# Patient Record
Sex: Female | Born: 1970 | Hispanic: Yes | Marital: Married | Smoking: Never smoker
Health system: Southern US, Community
[De-identification: ages and names within clinical notes are randomized; demographics above are authoritative.]

## PROBLEM LIST (undated history)

## (undated) DIAGNOSIS — O24419 Gestational diabetes mellitus in pregnancy, unspecified control: Secondary | ICD-10-CM

## (undated) DIAGNOSIS — Z789 Other specified health status: Secondary | ICD-10-CM

## (undated) HISTORY — DX: Gestational diabetes mellitus in pregnancy, unspecified control: O24.419

## (undated) HISTORY — PX: NO PAST SURGERIES: SHX2092

## (undated) HISTORY — PX: BREAST ENHANCEMENT SURGERY: SHX7

---

## 2005-03-08 ENCOUNTER — Emergency Department: Payer: Self-pay | Admitting: Internal Medicine

## 2005-05-28 ENCOUNTER — Observation Stay: Payer: Self-pay | Admitting: Obstetrics and Gynecology

## 2005-08-18 ENCOUNTER — Inpatient Hospital Stay: Payer: Self-pay

## 2011-09-29 ENCOUNTER — Emergency Department: Payer: Self-pay | Admitting: Emergency Medicine

## 2012-07-07 ENCOUNTER — Emergency Department: Payer: Self-pay | Admitting: Emergency Medicine

## 2012-07-07 LAB — URINALYSIS, COMPLETE
Bilirubin,UR: NEGATIVE
Leukocyte Esterase: NEGATIVE
Nitrite: NEGATIVE
Ph: 6 (ref 4.5–8.0)
Protein: NEGATIVE
RBC,UR: <1 /HPF (ref 0–5)
Squamous Epithelial: 14
WBC UR: 1 /HPF (ref 0–5)

## 2012-07-07 LAB — COMPREHENSIVE METABOLIC PANEL
Albumin: 3.8 g/dL (ref 3.4–5.0)
Alkaline Phosphatase: 64 U/L (ref 50–136)
BUN: 11 mg/dL (ref 7–18)
Bilirubin,Total: 1 mg/dL (ref 0.2–1.0)
Calcium, Total: 8.9 mg/dL (ref 8.5–10.1)
Chloride: 107 mmol/L (ref 98–107)
EGFR (African American): >60
EGFR (Non-African Amer.): >60
Glucose: 90 mg/dL (ref 65–99)
Osmolality: 271 (ref 275–301)
Potassium: 4.1 mmol/L (ref 3.5–5.1)
SGOT(AST): 38 U/L — ABNORMAL HIGH (ref 15–37)
SGPT (ALT): 32 U/L (ref 12–78)

## 2012-07-07 LAB — CBC
MCHC: 33.6 g/dL (ref 32.0–36.0)
RDW: 12.5 % (ref 11.5–14.5)
WBC: 5.8 10*3/uL (ref 3.6–11.0)

## 2012-07-07 LAB — TROPONIN I: Troponin-I: <0.02 ng/mL

## 2012-07-07 LAB — WET PREP, GENITAL

## 2012-09-06 ENCOUNTER — Ambulatory Visit: Payer: Self-pay | Admitting: Family Medicine

## 2012-12-08 ENCOUNTER — Emergency Department: Payer: Self-pay | Admitting: Emergency Medicine

## 2012-12-08 LAB — URINALYSIS, COMPLETE
Bilirubin,UR: NEGATIVE
Ketone: NEGATIVE
Ph: 6 (ref 4.5–8.0)
Protein: NEGATIVE
Specific Gravity: 1.01 (ref 1.003–1.030)

## 2012-12-08 LAB — BASIC METABOLIC PANEL
Calcium, Total: 9.4 mg/dL (ref 8.5–10.1)
Chloride: 105 mmol/L (ref 98–107)
Co2: 28 mmol/L (ref 21–32)
EGFR (Non-African Amer.): >60
Glucose: 91 mg/dL (ref 65–99)
Osmolality: 274 (ref 275–301)
Sodium: 137 mmol/L (ref 136–145)

## 2012-12-08 LAB — CBC
HGB: 13.4 g/dL (ref 12.0–16.0)
MCV: 96 fL (ref 80–100)
WBC: 8.5 10*3/uL (ref 3.6–11.0)

## 2012-12-08 LAB — HCG, QUANTITATIVE, PREGNANCY: Beta Hcg, Quant.: <1 m[IU]/mL — ABNORMAL LOW

## 2014-12-20 ENCOUNTER — Encounter: Payer: Self-pay | Admitting: Emergency Medicine

## 2014-12-20 ENCOUNTER — Emergency Department
Admission: EM | Admit: 2014-12-20 | Discharge: 2014-12-21 | Disposition: A | Discharge status: Home / Self Care | Payer: Self-pay | Attending: Emergency Medicine | Admitting: Emergency Medicine

## 2014-12-20 ENCOUNTER — Other Ambulatory Visit: Payer: Self-pay

## 2014-12-20 DIAGNOSIS — K219 Gastro-esophageal reflux disease without esophagitis: Secondary | ICD-10-CM

## 2014-12-20 DIAGNOSIS — Z3202 Encounter for pregnancy test, result negative: Secondary | ICD-10-CM | POA: Insufficient documentation

## 2014-12-20 DIAGNOSIS — R079 Chest pain, unspecified: Secondary | ICD-10-CM

## 2014-12-20 DIAGNOSIS — K5730 Diverticulosis of large intestine without perforation or abscess without bleeding: Secondary | ICD-10-CM

## 2014-12-20 LAB — CBC
HCT: 39.4 % (ref 35.0–47.0)
HEMOGLOBIN: 13.1 g/dL (ref 12.0–16.0)
MCH: 31.9 pg (ref 26.0–34.0)
MCHC: 33.3 g/dL (ref 32.0–36.0)
MCV: 96 fL (ref 80.0–100.0)
PLATELETS: 198 10*3/uL (ref 150–440)
RBC: 4.11 MIL/uL (ref 3.80–5.20)
RDW: 12.8 % (ref 11.5–14.5)
WBC: 8.1 10*3/uL (ref 3.6–11.0)

## 2014-12-20 LAB — TROPONIN I

## 2014-12-20 LAB — BASIC METABOLIC PANEL
ANION GAP: 8 (ref 5–15)
BUN: 14 mg/dL (ref 6–20)
CALCIUM: 8.9 mg/dL (ref 8.9–10.3)
CO2: 21 mmol/L — ABNORMAL LOW (ref 22–32)
CREATININE: 0.76 mg/dL (ref 0.44–1.00)
Chloride: 107 mmol/L (ref 101–111)
GFR calc Af Amer: >60 mL/min (ref >60)
GLUCOSE: 105 mg/dL — AB (ref 65–99)
Potassium: 4.2 mmol/L (ref 3.5–5.1)
Sodium: 136 mmol/L (ref 135–145)

## 2014-12-20 MED ORDER — IOHEXOL 240 MG/ML SOLN
25.0000 mL | Freq: Once | INTRAMUSCULAR | Status: AC | PRN
  Administered 2014-12-21: 25 mL via ORAL

## 2014-12-20 MED ORDER — ONDANSETRON HCL 4 MG/2ML IJ SOLN
4.0000 mg | Freq: Once | INTRAMUSCULAR | Status: AC
  Administered 2014-12-20: 4 mg via INTRAVENOUS
  Filled 2014-12-20: qty 2

## 2014-12-20 MED ORDER — MORPHINE SULFATE (PF) 2 MG/ML IV SOLN
2.0000 mg | Freq: Once | INTRAVENOUS | Status: AC
  Administered 2014-12-20: 2 mg via INTRAVENOUS
  Filled 2014-12-20: qty 1

## 2014-12-20 MED ORDER — SODIUM CHLORIDE 0.9 % IV BOLUS (SEPSIS)
1000.0000 mL | Freq: Once | INTRAVENOUS | Status: AC
  Administered 2014-12-20: 1000 mL via INTRAVENOUS

## 2014-12-20 NOTE — ED Notes (Signed)
Chest pain for 3 days, has numbness to left arm and headache.

## 2014-12-20 NOTE — ED Provider Notes (Signed)
[facility] Emergency Department Provider Note  ____________________________________________  Time seen: 11:45 PM  I have reviewed the triage vital signs and the nursing notes.   HISTORY  Chief Complaint Chest Pain     HPI Bailey Simpson is a 44 y.o. female presents with central chest pain with radiation to her left arm times 3 days. Patient states the pain is accompanied by headache. Patient denies any dyspnea no nausea or vomiting or diaphoresis. Patient also admits to left upper left lower quadrant abdominal pain     Past medical history Migraines There are no active problems to display for this patient.   Past surgical history None No current outpatient prescriptions on file.  Allergies No known drug allergies No family history on file.  Social History Social History  Substance Use Topics  . Smoking status: None  . Smokeless tobacco: None  . Alcohol Use: None    Review of Systems  Constitutional: Negative for fever. Eyes: Negative for visual changes. ENT: Negative for sore throat. Cardiovascular: Positive for chest pain. Respiratory: Negative for shortness of breath. Gastrointestinal: Positive for abdominal pain, vomiting and diarrhea. Genitourinary: Negative for dysuria. Musculoskeletal: Negative for back pain. Skin: Negative for rash. Neurological: Positive for headaches, negative for focal weakness or numbness.   10-point ROS otherwise negative.  ____________________________________________   PHYSICAL EXAM:  VITAL SIGNS: ED Triage Vitals  Enc Vitals Group     BP 12/20/14 2203 115/50 mmHg     Pulse Rate 12/20/14 2203 76     Resp 12/20/14 2203 18     Temp 12/20/14 2203 98.2 F (36.8 C)     Temp Source 12/20/14 2203 Oral     SpO2 12/20/14 2203 100 %     Weight 12/20/14 2203 180 lb (81.647 kg)     Height 12/20/14 2203  (1.702 m)     Head Cir --      Peak Flow --      Pain Score 12/20/14 2204 6   Pain Loc --      Pain Edu? --      Excl. in GC? --      Constitutional: Alert and oriented. Well appearing and in no distress. Eyes: Conjunctivae are normal. PERRL. Normal extraocular movements. ENT   Head: Normocephalic and atraumatic.   Nose: No congestion/rhinnorhea.   Mouth/Throat: Mucous membranes are moist.   Neck: No stridor. Hematological/Lymphatic/Immunilogical: No cervical lymphadenopathy. Cardiovascular: Normal rate, regular rhythm. Normal and symmetric distal pulses are present in all extremities. No murmurs, rubs, or gallops. Respiratory: Normal respiratory effort without tachypnea nor retractions. Breath sounds are clear and equal bilaterally. No wheezes/rales/rhonchi. Gastrointestinal: Pain with gentle palpation of the abdomen epigastric left upper quadrant and left lower quadrant. No distention. There is no CVA tenderness. Genitourinary: deferred Musculoskeletal: Nontender with normal range of motion in all extremities. No joint effusions.  No lower extremity tenderness nor edema. Neurologic:  Normal speech and language. No gross focal neurologic deficits are appreciated. Speech is normal.  Skin:  Skin is warm, dry and intact. No rash noted. Psychiatric: Mood and affect are normal. Speech and behavior are normal. Patient exhibits appropriate insight and judgment.  ____________________________________________    LABS (pertinent positives/negatives)  Labs Reviewed  BASIC METABOLIC PANEL - Abnormal; Notable for the following:    CO2 21 (*)    Glucose, Bld 105 (*)    All other components within normal limits  CBC  TROPONIN I  FIBRIN DERIVATIVES D-DIMER ([facility] ONLY)  LIPASE, BLOOD  TROPONIN I  POC URINE PREG, ED  POCT PREGNANCY, URINE     ____________________________________________   EKG  ED ECG REPORT I, BROWN, Ta[place] N, the attending physician, personally viewed and interpreted this ECG.   Date: 12/21/2014  EKG Time: 9:58 PM  Rate: 78   Rhythm: Normal sinus rhythm  Axis: None  Intervals: Normal  ST&T Change: None   ____________________________________________    RADIOLOGY CT Abdomen Pelvis W Contrast (Final result) Result time: 12/21/14 01:33:05   Final result by Rad Results In Interface (12/21/14 01:33:05)   Narrative:   CLINICAL DATA: Left lower quadrant pain with nausea for 4 days. Generalized abdominal pain.  EXAM: CT ABDOMEN AND PELVIS WITH CONTRAST  TECHNIQUE: Multidetector CT imaging of the abdomen and pelvis was performed using the standard protocol following bolus administration of intravenous contrast.  CONTRAST: OMNIPAQUE IOHEXOL 300 MG/ML SOLN  COMPARISON: None.  FINDINGS: Lower chest: The included lung bases are clear, allowing for mild motion artifact.  Liver: Normal in size without focal lesion.  Hepatobiliary: Gallbladder physiologically distended, no calcified gallstone. No biliary dilatation.  Pancreas: Normal.  Spleen: Normal.  Adrenal glands: No nodule.  Kidneys: Symmetric renal enhancement and excretion. No hydronephrosis. No focal renal abnormality.  Stomach/Bowel: Stomach physiologically distended. There are no dilated or thickened small bowel loops. Small volume of stool throughout the colon without colonic wall thickening. Mild distal colonic diverticulosis without diverticulitis. The appendix is normal.  Vascular/Lymphatic: No retroperitoneal adenopathy. Abdominal aorta is normal in caliber.  Reproductive: Uterus is normal in size. Left ovary is normal in size. Right ovary not clearly identified.  Bladder: Minimally distended, no wall thickening.  Other: No free air, free fluid, or intra-abdominal fluid collection. Tiny fat containing umbilical hernia.  Musculoskeletal: There are no acute or suspicious osseous abnormalities.  IMPRESSION: 1. No acute abnormality in the abdomen/pelvis. 2. Mild distal colonic diverticulosis without  diverticulitis.   Electronically Signed By: Rubye Oaks M.D. On: 12/21/2014 01:33              INITIAL IMPRESSION / ASSESSMENT AND PLAN / ED COURSE  Pertinent labs & imaging results that were available during my care of the patient were reviewed by me and considered in my medical decision making (see chart for details).  EKG unremarkable, Cardiac enzymes negative 2, d-dimer negative and a patient that is PERC 0. Patient referred to cardiology for outpatient follow-up.  ____________________________________________   FINAL CLINICAL IMPRESSION(S) / ED DIAGNOSES  Final diagnoses:  Gastroesophageal reflux disease without esophagitis  Chest pain, unspecified chest pain type  Diverticulosis of large intestine without hemorrhage      Darci Current, MD 12/21/14 [phone]

## 2014-12-21 ENCOUNTER — Emergency Department: Payer: Self-pay

## 2014-12-21 LAB — TROPONIN I

## 2014-12-21 LAB — POCT PREGNANCY, URINE: PREG TEST UR: NEGATIVE

## 2014-12-21 LAB — LIPASE, BLOOD: Lipase: 22 U/L (ref 22–51)

## 2014-12-21 LAB — FIBRIN DERIVATIVES D-DIMER (ARMC ONLY): Fibrin derivatives D-dimer (ARMC): 362 (ref 0–499)

## 2014-12-21 MED ORDER — MORPHINE SULFATE (PF) 2 MG/ML IV SOLN
2.0000 mg | Freq: Once | INTRAVENOUS | Status: AC
  Administered 2014-12-21: 2 mg via INTRAVENOUS

## 2014-12-21 MED ORDER — MORPHINE SULFATE (PF) 2 MG/ML IV SOLN
INTRAVENOUS | Status: AC
  Administered 2014-12-21: 2 mg via INTRAVENOUS
  Filled 2014-12-21: qty 1

## 2014-12-21 MED ORDER — IOHEXOL 300 MG/ML  SOLN
100.0000 mL | Freq: Once | INTRAMUSCULAR | Status: AC | PRN
  Administered 2014-12-21: 100 mL via INTRAVENOUS

## 2014-12-21 MED ORDER — OMEPRAZOLE 40 MG PO CPDR: 40.0000 mg | DELAYED_RELEASE_CAPSULE | Freq: Every day | ORAL | Status: DC

## 2014-12-21 NOTE — ED Notes (Signed)
Patient transported to CT 

## 2014-12-21 NOTE — Discharge Instructions (Signed)
Dolor de pecho (no especfico) (Chest Pain (Nonspecific)) Con frecuencia es difcil dar un diagnstico especfico de la causa del dolor de [place]. Siempre hay una posibilidad de que el dolor podra estar relacionado con algo grave, como un ataque al corazn o un cogulo sanguneo en los pulmones. Debe someterse a controles con el mdico para ms evaluaciones. CAUSAS   Acidez.  Neumona o bronquitis.  Ansiedad o estrs.  Inflamacin de la zona que rodea al corazn (pericarditis) o a los pulmones (pleuritis o pleuresa).  Un cogulo sanguneo en el pulmn.  Colapso de un pulmn (neumotrax), que puede aparecer de [employer] repentina por s solo (neumotrax espontneo) o debido a un traumatismo en el trax.  Culebrilla (virus del herpes zster). La pared torcica est compuesta por huesos, msculos y Database administrator. Cualquiera de estos puede ser la fuente del dolor.  Puede haber una contusin en los huesos debido a una lesin.  Puede haber un esguince en los msculos o el cartlago ocasionado por la tos o por [place].  El cartlago puede verse afectado por una inflamacin y Engineer, production (costocondritis). DIAGNSTICO  Ileene Hutchinson se necesiten anlisis de laboratorio u otros estudios para Animator causa del Social research officer, government. Adems, puede indicarle que se haga una prueba llamada electrocadiograma (ECG) ambulatorio. El ECG registra los patrones de los latidos cardacos durante 24horas. Adems, pueden hacerle otros estudios, por ejemplo:  Ecocardiograma transtorcico (ETT). Durante IT trainer, se usan ondas sonoras para evaluar el flujo de la sangre a travs del corazn.  Ecocardiograma transesofgico (ETE).  Monitoreo cardaco. Permite que el mdico controle la frecuencia y el ritmo cardaco en tiempo real.  Monitor Holter. Es un dispositivo porttil que [employer] latidos cardacos y [place] a Retail buyer las arritmias cardacas. Le permite al [employer] registrar la actividad [place], si es necesario.  Pruebas de estrs por ejercicio o por medicamentos que aceleran los latidos cardacos. TRATAMIENTO   El tratamiento depende de la causa del dolor de [place]. El tratamiento puede incluir:  Inhibidores de la acidez estomacal.  Antiinflamatorios.  Analgsicos para las enfermedades inflamatorias.  Antibiticos, si hay una infeccin.  Podrn aconsejarle que modifique su estilo de vida. Esto incluye dejar de fumar y evitar el alcohol, la cafena y el chocolate.  Pueden aconsejarle que mantenga la cabeza levantada (elevada) cuando duerme. Esto reduce la probabilidad de que el cido retroceda del estmago al esfago. En la [employer], el dolor de pecho no especfico mejorar en el trmino de 2 a 3das, con reposo y [employer].  INSTRUCCIONES PARA EL CUIDADO EN EL HOGAR   Si le prescriben antibiticos, tmelos tal como se le indic. Termnelos aunque comience a sentirse mejor.  [address] no haga actividades fsicas que provoquen dolor de [place]. Contine con las actividades fsicas tal como se le indic  No consuma ningn producto que contenga tabaco, incluidos cigarrillos, tabaco de Higher education careers adviser o cigarrillos electrnicos.  Evite el consumo de alcohol.  Tome los medicamentos solamente como se lo haya indicado el mdico.  Siga las sugerencias del mdico en lo que respecta a las pruebas adicionales, si el dolor de pecho no desaparece.  Concurra a todas las visitas de control programadas. Si no lo hace, podra desarrollar problemas permanentes (crnicos) relacionados con el dolor. Si hay algn problema para concurrir a una cita, llame para reprogramarla. SOLICITE ATENCIN MDICA SI:   El dolor de pecho no desaparece, incluso despus del tratamiento.  Tiene una erupcin cutnea con ampollas en el  pecho.  Lance Muss. SOLICITE ATENCIN MDICA DE [employer] SI:   Aumenta el dolor de pecho o este se irradia hacia el  brazo, el cuello, la [place], la espalda o el abdomen.  Le falta el aire.  La tos empeora, o expectora sangre.  Siente dolor intenso en la espalda o el abdomen.  Se siente nauseoso o vomita.  Siente debilidad intensa.  Se desmaya.  Tiene escalofros. Esto es Radio broadcast assistant. No espere a ver si el dolor se pasa. Obtenga ayuda mdica de inmediato. Llame a los servicios de emergencia locales (911 en los [place]). No conduzca por sus propios medios [employer] [facility]. ASEGRESE DE QUE:   Comprende estas instrucciones.  Controlar su afeccin.  Recibir ayuda de inmediato si no mejora o si empeora. Document Released: 03/09/2005 Document Revised: 03/14/2013 [facility] Patient Information 2015 [place], [place]. This information is not intended to replace advice given to you by your health care provider. Make sure you discuss any questions you have with your health care provider.  Diverticulosis (Diverticulosis) La diverticulosis es una enfermedad que aparece cuando se forman pequeos bolsillos (divertculos) en las paredes del colon. El colon, o intestino grueso, es el lugar donde se absorbe agua y se forman las heces. Los bolsillos se forman cuando la capa interna del colon ejerce presin sobre los puntos dbiles de las capas externas. CAUSAS  Nadie sabe con exactitud qu causa la diverticulosis. FACTORES DE RIESGO  Ser mayor de 83aos. El riesgo de desarrollar esta enfermedad aumenta con la edad. La diverticulosis es poco frecuente en las personas menores de [place]. A los 80aos, casi todas las personas tienen la enfermedad.  Comer una dieta con bajo contenido de [place].  Estar estreido con frecuencia.  Tener sobrepeso.  No hacer suficiente ejercicio fsico.  Fumar.  Tomar analgsicos de [address], como aspirina e ibuprofeno. SNTOMAS  La mayora de las personas que tienen diverticulosis no presentan sntomas. DIAGNSTICO  Dado que la diverticulosis no suele  causar sntomas, los mdicos a menudo descubren la enfermedad durante un examen de otros problemas de colon. En muchos casos, el mdico diagnosticar la diverticulosis mientras utiliza un endoscopio flexible para examinar el colon (colonoscopa). TRATAMIENTO  Si nunca tuvo una infeccin relacionada con la diverticulosis, es posible que no necesite tratamiento. Si ha tenido una infeccin antes, el tratamiento puede incluir:  Comer ms frutas, verduras y cereales.  Tomar un suplemento de [place].  Tomar un suplemento de bacterias vivas (probitico).  Tomar medicamentos para relajar el colon. INSTRUCCIONES PARA EL CUIDADO EN EL HOGAR   Beba por lo menos entre 6 y 8vasos de agua por da para [place].  Trate de no hacer fuerza al mover el intestino.  Cumpla con todas las visitas de control. Si ha tenido una infeccin antes:   Aumente la cantidad de fibra en la dieta, segn las indicaciones del mdico o del nutricionista.  Tome un suplemento dietario con fibras si el mdico lo autoriza.  Tome los medicamentos solamente como se lo haya indicado el mdico. SOLICITE ATENCIN MDICA SI:   Siente dolor abdominal.  Tiene meteorismo.  Tiene clicos.  No ha defecado en 3das. SOLICITE ATENCIN MDICA DE INMEDIATO SI:   El dolor empeora.  El meteorismo [employer].  Tiene fiebre o escalofros, y los sntomas empeoran repentinamente.  Comienza a vomitar.  La materia fecal es sanguinolenta o negra. ASEGRESE DE QUE:  Comprende estas instrucciones.  Controlar su afeccin.  Recibir ayuda de inmediato si no mejora o si  empeora. Document Released: 02/20/2008 Document Revised: 03/14/2013 [facility] Patient Information 2015 [place], [place]. This information is not intended to replace advice given to you by your health care provider. Make sure you discuss any questions you have with your health care provider.

## 2014-12-21 NOTE — ED Notes (Signed)
Patient with no complaints at this time. Respirations even and unlabored. Skin warm/dry. Discharge instructions reviewed with patient at this time. Patient given opportunity to voice concerns/ask questions. IV removed per policy and band-aid applied to site. Patient discharged at this time and left Emergency Department with steady gait, accompanied by family.   

## 2016-01-27 ENCOUNTER — Encounter: Payer: Self-pay | Admitting: Emergency Medicine

## 2016-01-27 DIAGNOSIS — Z5321 Procedure and treatment not carried out due to patient leaving prior to being seen by health care provider: Secondary | ICD-10-CM | POA: Insufficient documentation

## 2016-01-27 DIAGNOSIS — O99611 Diseases of the digestive system complicating pregnancy, first trimester: Secondary | ICD-10-CM | POA: Insufficient documentation

## 2016-01-27 DIAGNOSIS — K0889 Other specified disorders of teeth and supporting structures: Secondary | ICD-10-CM | POA: Insufficient documentation

## 2016-01-27 DIAGNOSIS — Z3A01 Less than 8 weeks gestation of pregnancy: Secondary | ICD-10-CM | POA: Insufficient documentation

## 2016-01-27 NOTE — ED Triage Notes (Signed)
Pt presents to ED with c/o left lower tooth pain x 1 month, pt reports was rx antibiotics but reports stopped taking them due to finding out she was pregnant. Pt reports pain has not eased. Pt is currently [redacted] weeks pregnant.

## 2016-01-28 ENCOUNTER — Emergency Department
Admission: EM | Admit: 2016-01-28 | Discharge: 2016-01-28 | Disposition: A | Discharge status: Home / Self Care | Payer: Self-pay | Attending: Emergency Medicine | Admitting: Emergency Medicine

## 2016-02-05 ENCOUNTER — Emergency Department
Admission: EM | Admit: 2016-02-05 | Discharge: 2016-02-05 | Disposition: A | Discharge status: Home / Self Care | Payer: Self-pay | Attending: Emergency Medicine | Admitting: Emergency Medicine

## 2016-02-05 ENCOUNTER — Encounter: Payer: Self-pay | Admitting: *Deleted

## 2016-02-05 DIAGNOSIS — N3091 Cystitis, unspecified with hematuria: Secondary | ICD-10-CM | POA: Insufficient documentation

## 2016-02-05 DIAGNOSIS — N3090 Cystitis, unspecified without hematuria: Secondary | ICD-10-CM

## 2016-02-05 LAB — URINALYSIS COMPLETE WITH MICROSCOPIC (ARMC ONLY)
Bilirubin Urine: NEGATIVE
Glucose, UA: NEGATIVE mg/dL
Hgb urine dipstick: NEGATIVE
Ketones, ur: NEGATIVE mg/dL
NITRITE: NEGATIVE
PROTEIN: NEGATIVE mg/dL
SPECIFIC GRAVITY, URINE: 1.016 (ref 1.005–1.030)
pH: 6 (ref 5.0–8.0)

## 2016-02-05 LAB — POCT PREGNANCY, URINE: Preg Test, Ur: NEGATIVE

## 2016-02-05 MED ORDER — SULFAMETHOXAZOLE-TRIMETHOPRIM 800-160 MG PO TABS: 1.0000 | ORAL_TABLET | Freq: Two times a day (BID) | ORAL | 0 refills | Status: DC

## 2016-02-05 NOTE — ED Triage Notes (Signed)
Pt reports burning with urination and lower back pain for the past 3 days. Also reports having hematuria for the past day. Pt denies fever and chills. Patient also reports having both positive and negative tests at home. Reports nausea and breast tenderness as well. States her last menstrual cycle was in September.

## 2016-02-05 NOTE — ED Provider Notes (Signed)
[facility] Emergency Department Provider Note        Time seen: ----------------------------------------- 12:23 PM on 02/05/2016 -----------------------------------------    I have reviewed the triage vital signs and the nursing notes.   HISTORY  Chief Complaint Dysuria    HPI Bailey Simpson is a 45 y.o. female who presents to the ER fordysuria and hematuria. Patient is also unaware if she is pregnant or not. Patient states she's had several test that showed she was pregnant and several that showed that she was not pregnant. Patient states her last menstrual cycle was in September. She states she feels dizzy, has some abdominal pain but otherwise denies complaints.   History reviewed. No pertinent past medical history.  There are no active problems to display for this patient.   History reviewed. No pertinent surgical history.  Allergies Patient has no known allergies.  Social History Social History  Substance Use Topics  . Smoking status: Never Smoker  . Smokeless tobacco: Never Used  . Alcohol use No    Review of Systems Constitutional: Negative for fever. Cardiovascular: Negative for chest pain. Respiratory: Negative for shortness of breath. Gastrointestinal: Negative for abdominal pain, vomiting and diarrhea. Genitourinary: Positive for dysuria, hematuria Musculoskeletal: Negative for back pain. Skin: Negative for rash. Neurological: Negative for headaches, focal weakness or numbness.  10-point ROS otherwise negative.  ____________________________________________   PHYSICAL EXAM:  VITAL SIGNS: ED Triage Vitals  Enc Vitals Group     BP 02/05/16 1154 (!) 111/51     Pulse Rate 02/05/16 1154 70     Resp 02/05/16 1154 18     Temp 02/05/16 1154 98.3 F (36.8 C)     Temp Source 02/05/16 1154 Oral     SpO2 02/05/16 1154 100 %     Weight 02/05/16 1155 200 lb (90.7 kg)     Height 02/05/16 1155 5\' 7"  (1.702 m)     Head  Circumference --      Peak Flow --      Pain Score --      Pain Loc --      Pain Edu? --      Excl. in GC? --    Constitutional: Alert and oriented. Well appearing and in no distress. Eyes: Conjunctivae are normal. PERRL. Normal extraocular movements. ENT   Head: Normocephalic and atraumatic.   Nose: No congestion/rhinnorhea.   Mouth/Throat: Mucous membranes are moist.   Neck: No stridor. Cardiovascular: Normal rate, regular rhythm. No murmurs, rubs, or gallops. Respiratory: Normal respiratory effort without tachypnea nor retractions. Breath sounds are clear and equal bilaterally. No wheezes/rales/rhonchi. Gastrointestinal: Soft and nontender. Normal bowel sounds Musculoskeletal: Nontender with normal range of motion in all extremities. No lower extremity tenderness nor edema. Neurologic:  Normal speech and language. No gross focal neurologic deficits are appreciated.  Skin:  Skin is warm, dry and intact. No rash noted. Psychiatric: Mood and affect are normal. Speech and behavior are normal.  ____________________________________________  ED COURSE:  Pertinent labs & imaging results that were available during my care of the patient were reviewed by me and considered in my medical decision making (see chart for details). Clinical Course   Patient is in no distress, we will assess with urinalysis and urine pregnancy test.  Procedures ____________________________________________   LABS (pertinent positives/negatives)  Labs Reviewed  URINALYSIS COMPLETEWITH MICROSCOPIC ([facility] ONLY) - Abnormal; Notable for the following:       Result Value   Color, Urine YELLOW (*)    APPearance  HAZY (*)    Leukocytes, UA 2+ (*)    Bacteria, UA RARE (*)    Squamous Epithelial / LPF 6-30 (*)    All other components within normal limits  POCT PREGNANCY, URINE   ____________________________________________  FINAL ASSESSMENT AND PLAN  Cystitis  Plan: Patient with labs and imaging  as dictated above. Patient is no distress, she'll be discharged with Septra and encouraged to have close follow-up with her doctor.   Bailey Simpson, Bailey Olden E, MD   Note: This dictation was prepared with Dragon dictation. Any transcriptional errors that result from this process are unintentional    Bailey FilbertJonathan Simpson Areona Homer, MD 02/05/16 1316

## 2016-11-13 ENCOUNTER — Other Ambulatory Visit: Payer: Self-pay | Admitting: Advanced Practice Midwife

## 2016-11-13 DIAGNOSIS — Z369 Encounter for antenatal screening, unspecified: Secondary | ICD-10-CM

## 2016-11-13 LAB — OB RESULTS CONSOLE VARICELLA ZOSTER ANTIBODY, IGG: VARICELLA IGG: IMMUNE

## 2016-11-13 LAB — OB RESULTS CONSOLE RPR: RPR: NONREACTIVE

## 2016-11-13 LAB — OB RESULTS CONSOLE RUBELLA ANTIBODY, IGM: Rubella: IMMUNE

## 2016-11-13 LAB — OB RESULTS CONSOLE GC/CHLAMYDIA
Chlamydia: NEGATIVE
Gonorrhea: NEGATIVE

## 2016-11-13 LAB — OB RESULTS CONSOLE HEPATITIS B SURFACE ANTIGEN: Hepatitis B Surface Ag: NEGATIVE

## 2016-11-13 LAB — OB RESULTS CONSOLE HIV ANTIBODY (ROUTINE TESTING): HIV: NONREACTIVE

## 2016-12-10 ENCOUNTER — Other Ambulatory Visit
Admission: RE | Admit: 2016-12-10 | Discharge: 2016-12-10 | Disposition: A | Discharge status: Home / Self Care | Payer: Medicaid Other | Source: Ambulatory Visit | Attending: Maternal & Fetal Medicine | Admitting: Maternal & Fetal Medicine

## 2016-12-10 ENCOUNTER — Ambulatory Visit (HOSPITAL_BASED_OUTPATIENT_CLINIC_OR_DEPARTMENT_OTHER)
Admission: RE | Admit: 2016-12-10 | Discharge: 2016-12-10 | Disposition: A | Discharge status: Home / Self Care | Payer: Medicaid Other | Source: Ambulatory Visit | Attending: Maternal & Fetal Medicine | Admitting: Maternal & Fetal Medicine

## 2016-12-10 ENCOUNTER — Ambulatory Visit
Admission: RE | Admit: 2016-12-10 | Discharge: 2016-12-10 | Disposition: A | Discharge status: Home / Self Care | Payer: Medicaid Other | Source: Ambulatory Visit | Attending: Maternal & Fetal Medicine | Admitting: Maternal & Fetal Medicine

## 2016-12-10 ENCOUNTER — Ambulatory Visit: Payer: Self-pay

## 2016-12-10 ENCOUNTER — Encounter: Payer: Self-pay | Admitting: *Deleted

## 2016-12-10 VITALS — BP 116/57 | HR 71 | Temp 98.9°F | Resp 18 | Wt 210.2 lb

## 2016-12-10 DIAGNOSIS — Z31438 Encounter for other genetic testing of female for procreative management: Secondary | ICD-10-CM | POA: Diagnosis not present

## 2016-12-10 DIAGNOSIS — Z3A00 Weeks of gestation of pregnancy not specified: Secondary | ICD-10-CM | POA: Diagnosis not present

## 2016-12-10 DIAGNOSIS — O09521 Supervision of elderly multigravida, first trimester: Secondary | ICD-10-CM | POA: Diagnosis not present

## 2016-12-10 DIAGNOSIS — O09529 Supervision of elderly multigravida, unspecified trimester: Secondary | ICD-10-CM

## 2016-12-10 DIAGNOSIS — Z369 Encounter for antenatal screening, unspecified: Secondary | ICD-10-CM | POA: Diagnosis present

## 2016-12-10 DIAGNOSIS — Z315 Encounter for genetic counseling: Secondary | ICD-10-CM | POA: Insufficient documentation

## 2016-12-10 DIAGNOSIS — Z368A Encounter for antenatal screening for other genetic defects: Secondary | ICD-10-CM | POA: Insufficient documentation

## 2016-12-10 DIAGNOSIS — Z3A13 13 weeks gestation of pregnancy: Secondary | ICD-10-CM | POA: Insufficient documentation

## 2016-12-10 HISTORY — DX: Other specified health status: Z78.9

## 2016-12-10 NOTE — Progress Notes (Addendum)
Referring Provider:  Department, [place]* Length of Consultation: 60 minutes  Ms. Bailey Simpson was referred to [facility] for genetic counseling because of advanced maternal age.  The patient will be 46 years old at the time of delivery.  This note summarizes the information we discussed.    We explained that the chance of a chromosome abnormality increases with maternal age.  Chromosomes and examples of chromosome problems were reviewed.  Humans typically have 46 chromosomes in each cell, with half passed through each sperm and egg.  Any change in the number or structure of chromosomes can increase the risk of problems in the physical and mental development of a pregnancy.   Based upon age of the patient, the chance of any chromosome abnormality was 1 in 3. The chance of Down syndrome, the most common chromosome problem associated with maternal age, was 1 in 43.  The risk of chromosome problems is in addition to the 3% general population risk for birth defects and mental retardation.  The greatest chance, of course, is that the baby would be born in good health.  We discussed the following prenatal screening and testing options for this pregnancy:  First trimester screening, which includes nuchal translucency ultrasound screen and first trimester maternal serum marker screening.  The nuchal translucency has approximately an 80% detection rate for Down syndrome and can be positive for other chromosome abnormalities as well as heart defects.  When combined with a maternal serum marker screening, the detection rate is up to 90% for Down syndrome and up to 97% for trisomy 18.     The chorionic villus sampling procedure is available for first trimester chromosome analysis.  This involves the withdrawal of a small amount of chorionic villi (tissue from the developing placenta).  Risk of pregnancy loss is estimated to be approximately 1 in 200 to 1 in 100 (0.5 to 1%).   There is approximately a 1% (1 in 100) chance that the CVS chromosome results will be unclear.  Chorionic villi cannot be tested for neural tube defects.     Maternal serum marker screening, a blood test that measures pregnancy proteins, can provide risk assessments for Down syndrome, trisomy 18, and open neural tube defects (spina bifida, anencephaly). Because it does not directly examine the fetus, it cannot positively diagnose or rule out these problems.  Targeted ultrasound uses high frequency sound waves to create an image of the developing fetus.  An ultrasound is often recommended as a routine means of evaluating the pregnancy.  It is also used to screen for fetal anatomy problems (for example, a heart defect) that might be suggestive of a chromosomal or other abnormality.   Amniocentesis involves the removal of a small amount of amniotic fluid from the sac surrounding the fetus with the use of a thin needle inserted through the maternal abdomen and uterus.  Ultrasound guidance is used throughout the procedure.  Fetal cells from amniotic fluid are directly evaluated and > 99.5% of chromosome problems and > 98% of open neural tube defects can be detected. This procedure is generally performed after the 15th week of pregnancy.  The main risks to this procedure include complications leading to miscarriage in less than 1 in 200 cases (0.5%).  We also reviewed the availability of cell free fetal DNA testing from maternal blood to determine whether or not the baby may have either Down syndrome, trisomy 34, or trisomy 18.  This test utilizes a maternal blood sample and  DNA sequencing technology to isolate circulating cell free fetal DNA from maternal plasma.  The fetal DNA can then be analyzed for DNA sequences that are derived from the three most common chromosomes involved in aneuploidy, chromosomes 13, 18, and 21.  If the overall amount of DNA is greater than the expected level for any of these  chromosomes, aneuploidy is suspected.  While we do not consider it a replacement for invasive testing and karyotype analysis, a negative result from this testing would be reassuring, though not a guarantee of a normal chromosome complement for the baby.  An abnormal result is certainly suggestive of an abnormal chromosome complement, though we would still recommend CVS or amniocentesis to confirm any findings from this testing.  Cystic Fibrosis and Spinal Muscular Atrophy (SMA) screening were also discussed with the patient. Both conditions are recessive, which means that both parents must be carriers in order to have a child with the disease.  Cystic fibrosis (CF) is one of the most common genetic conditions in persons of Caucasian ancestry.  This condition occurs in approximately 1 in 2,500 Caucasian persons and results in thickened secretions in the lungs, digestive, and reproductive systems.  For a baby to be at risk for having CF, both of the parents must be carriers for this condition.  Approximately 1 in 56 Caucasian persons is a carrier for CF.  Current carrier testing looks for the most common mutations in the gene for CF and can detect approximately 90% of carriers in the Caucasian population.  This means that the carrier screening can greatly reduce, but cannot eliminate, the chance for an individual to have a child with CF.  If an individual is found to be a carrier for CF, then carrier testing would be available for the partner. As part of [place]'s newborn screening profile, all babies born in the state of [place] will have a two-tier screening process.  Specimens are first tested to determine the concentration of immunoreactive trypsinogen (IRT).  The top 5% of specimens with the highest IRT values then undergo DNA testing using a panel of over 40 common CF mutations. SMA is a neurodegenerative disorder that leads to atrophy of skeletal muscle and overall weakness.  This condition is  also more prevalent in the Caucasian population, with 1 in 40-1 in 60 persons being a carrier and 1 in 6,000-1 in 10,000 children being affected.  There are multiple forms of the disease, with some causing death in infancy to other forms with survival into adulthood.  The genetics of SMA is complex, but carrier screening can detect up to 95% of carriers in the Caucasian population.  Similar to CF, a negative result can greatly reduce, but cannot eliminate, the chance to have a child with SMA. Hemoglobinopathy carrier screening was also offered to the patient.  We obtained a detailed family history and pregnancy history.  The family history is unremarkable for birth defects, developmental delays, recurrent pregnancy loss or known chromosome abnormalities.  Ms. Bailey Simpson stated that this is her fifth pregnancy, the first with this partner.  She has four healthy children from prior relationships.  She reported no complications or exposures that would be expected to increase the risk for birth defects in this pregnancy.  After consideration of the options, Ms. Bailey Simpson elected to proceed with cell free fetal DNA testing and an ultrasound today.  She declined carrier screening for CF, SMA and hemoglobinopathies. She was scheduled to return to clinic at [redacted] weeks  gestation for an anatomy ultrasound.  A fetal echocardiogram could also be considered due to the association between maternal age greater than 40 years and possible structural heart conditions.  An ultrasound was performed at the time of the visit.  The gestational age was consistent with  13 weeks.  Fetal anatomy could not be assessed due to early gestational age.  Please refer to the ultrasound report for details of that study.  Ms. Bailey Simpson was encouraged to call with questions or concerns.  We can be contacted at [phone].   Cherly Anderson, MS, CGC  I was immediately available and supervising. Argentina Ponder,  MD [facility]

## 2016-12-17 ENCOUNTER — Telehealth: Payer: Self-pay | Admitting: Obstetrics and Gynecology

## 2016-12-17 LAB — INFORMASEQ(SM) WITH XY ANALYSIS
FETAL FRACTION (%): 4.7
FETAL NUMBER: 1
GESTATIONAL AGE AT COLLECTION: 13.9 wk
Weight: 210 [lb_av]

## 2016-12-17 NOTE — Telephone Encounter (Signed)
The patient was informed of the results of her recent InformaSeq testing (performed at [facility]) which yielded NEGATIVE results with the aid of a Spanish interpreter.  The patient's specimen showed DNA consistent with two copies of chromosomes 21, 18 and 13.  The sensitivity for trisomy 1, trisomy 33 and trisomy 21 using this testing are reported as 99.1%, 98.3% and 98.1% respectively.  Thus, while the results of this testing are highly accurate, they are not considered diagnostic at this time.  Should more definitive information be desired, the patient may still consider amniocentesis.   As requested to know by the patient, sex chromosome analysis was included for this sample.  GENDER RESULTS WERE NOT GIVEN TO THE PATIENT BY PHONE, AS SHE REQUESTED THEY BE MAILED TO HER HOME. This is predicted with >97% accuracy.  A maternal serum AFP only should be considered if screening for neural tube defects is desired.  Cherly Anderson, MS, CGC

## 2016-12-28 NOTE — Addendum Note (Signed)
Encounter addended by: Lady Deutscher, MD on: 12/28/2016  9:05 AM<BR>    Actions taken: Sign clinical note, Charge Capture section accepted

## 2017-01-11 ENCOUNTER — Other Ambulatory Visit: Payer: Self-pay | Admitting: *Deleted

## 2017-01-11 DIAGNOSIS — O09529 Supervision of elderly multigravida, unspecified trimester: Secondary | ICD-10-CM

## 2017-01-14 ENCOUNTER — Ambulatory Visit
Admission: RE | Admit: 2017-01-14 | Discharge: 2017-01-14 | Disposition: A | Discharge status: Home / Self Care | Payer: Medicaid Other | Source: Ambulatory Visit | Attending: Obstetrics & Gynecology | Admitting: Obstetrics & Gynecology

## 2017-01-14 DIAGNOSIS — O09529 Supervision of elderly multigravida, unspecified trimester: Secondary | ICD-10-CM

## 2017-01-14 DIAGNOSIS — Z3A18 18 weeks gestation of pregnancy: Secondary | ICD-10-CM | POA: Insufficient documentation

## 2017-01-14 DIAGNOSIS — Z3689 Encounter for other specified antenatal screening: Secondary | ICD-10-CM | POA: Diagnosis not present

## 2017-01-14 DIAGNOSIS — O09522 Supervision of elderly multigravida, second trimester: Secondary | ICD-10-CM | POA: Insufficient documentation

## 2017-01-21 ENCOUNTER — Other Ambulatory Visit: Payer: Self-pay | Admitting: *Deleted

## 2017-01-21 DIAGNOSIS — O09522 Supervision of elderly multigravida, second trimester: Secondary | ICD-10-CM

## 2017-01-25 ENCOUNTER — Ambulatory Visit
Admission: RE | Admit: 2017-01-25 | Discharge: 2017-01-25 | Disposition: A | Discharge status: Home / Self Care | Payer: Medicaid Other | Source: Ambulatory Visit | Attending: Maternal & Fetal Medicine | Admitting: Maternal & Fetal Medicine

## 2017-01-25 DIAGNOSIS — O09522 Supervision of elderly multigravida, second trimester: Secondary | ICD-10-CM

## 2017-03-03 ENCOUNTER — Other Ambulatory Visit: Payer: Self-pay

## 2017-03-03 ENCOUNTER — Emergency Department
Admission: EM | Admit: 2017-03-03 | Discharge: 2017-03-03 | Disposition: A | Discharge status: Home / Self Care | Payer: Medicaid Other | Attending: Emergency Medicine | Admitting: Emergency Medicine

## 2017-03-03 ENCOUNTER — Encounter: Payer: Self-pay | Admitting: Emergency Medicine

## 2017-03-03 DIAGNOSIS — Z3A25 25 weeks gestation of pregnancy: Secondary | ICD-10-CM | POA: Diagnosis not present

## 2017-03-03 DIAGNOSIS — Z79899 Other long term (current) drug therapy: Secondary | ICD-10-CM | POA: Diagnosis not present

## 2017-03-03 DIAGNOSIS — O9989 Other specified diseases and conditions complicating pregnancy, childbirth and the puerperium: Secondary | ICD-10-CM | POA: Diagnosis present

## 2017-03-03 DIAGNOSIS — N3000 Acute cystitis without hematuria: Secondary | ICD-10-CM

## 2017-03-03 DIAGNOSIS — Z7982 Long term (current) use of aspirin: Secondary | ICD-10-CM | POA: Diagnosis not present

## 2017-03-03 DIAGNOSIS — M549 Dorsalgia, unspecified: Secondary | ICD-10-CM | POA: Insufficient documentation

## 2017-03-03 DIAGNOSIS — O2342 Unspecified infection of urinary tract in pregnancy, second trimester: Secondary | ICD-10-CM | POA: Diagnosis not present

## 2017-03-03 DIAGNOSIS — O2340 Unspecified infection of urinary tract in pregnancy, unspecified trimester: Secondary | ICD-10-CM | POA: Diagnosis not present

## 2017-03-03 DIAGNOSIS — O09521 Supervision of elderly multigravida, first trimester: Secondary | ICD-10-CM | POA: Diagnosis not present

## 2017-03-03 LAB — URINALYSIS, COMPLETE (UACMP) WITH MICROSCOPIC
BILIRUBIN URINE: NEGATIVE
Glucose, UA: NEGATIVE mg/dL
Hgb urine dipstick: NEGATIVE
KETONES UR: NEGATIVE mg/dL
LEUKOCYTES UA: NEGATIVE
Nitrite: NEGATIVE
PH: 6 (ref 5.0–8.0)
PROTEIN: NEGATIVE mg/dL
Specific Gravity, Urine: 1.01 (ref 1.005–1.030)

## 2017-03-03 LAB — CBC
HEMATOCRIT: 35.7 % (ref 35.0–47.0)
Hemoglobin: 12.1 g/dL (ref 12.0–16.0)
MCH: 32.1 pg (ref 26.0–34.0)
MCHC: 33.8 g/dL (ref 32.0–36.0)
MCV: 95.2 fL (ref 80.0–100.0)
PLATELETS: 195 10*3/uL (ref 150–440)
RBC: 3.75 MIL/uL — AB (ref 3.80–5.20)
RDW: 13.9 % (ref 11.5–14.5)
WBC: 10.1 10*3/uL (ref 3.6–11.0)

## 2017-03-03 LAB — COMPREHENSIVE METABOLIC PANEL
ALBUMIN: 3 g/dL — AB (ref 3.5–5.0)
ALT: 16 U/L (ref 14–54)
AST: 22 U/L (ref 15–41)
Alkaline Phosphatase: 56 U/L (ref 38–126)
Anion gap: 8 (ref 5–15)
BUN: 8 mg/dL (ref 6–20)
CHLORIDE: 104 mmol/L (ref 101–111)
CO2: 22 mmol/L (ref 22–32)
CREATININE: 0.39 mg/dL — AB (ref 0.44–1.00)
Calcium: 9.3 mg/dL (ref 8.9–10.3)
GFR calc Af Amer: >60 mL/min (ref >60)
GFR calc non Af Amer: >60 mL/min (ref >60)
GLUCOSE: 113 mg/dL — AB (ref 65–99)
POTASSIUM: 3.8 mmol/L (ref 3.5–5.1)
Sodium: 134 mmol/L — ABNORMAL LOW (ref 135–145)
Total Bilirubin: 0.5 mg/dL (ref 0.3–1.2)
Total Protein: 6.8 g/dL (ref 6.5–8.1)

## 2017-03-03 LAB — TROPONIN I: Troponin I: <0.03 ng/mL (ref <0.03)

## 2017-03-03 MED ORDER — CEPHALEXIN 500 MG PO CAPS: 500.0000 mg | ORAL_CAPSULE | Freq: Two times a day (BID) | ORAL | 0 refills | Status: AC

## 2017-03-03 MED ORDER — CEPHALEXIN 500 MG PO CAPS
500.0000 mg | ORAL_CAPSULE | Freq: Once | ORAL | Status: AC
  Administered 2017-03-03: 500 mg via ORAL
  Filled 2017-03-03: qty 1

## 2017-03-03 NOTE — ED Notes (Signed)
FHTs auscultated left lower abd--148 & regular

## 2017-03-03 NOTE — ED Triage Notes (Signed)
Patient ambulatory to triage with steady gait, without difficulty or distress noted; pt reports having intermittent pain to left shoulder and beneath left scapula x 2wks  with no known injury accomp by dizziness and SHOB; [redacted]wks pregnant

## 2017-03-03 NOTE — ED Notes (Signed)
Pain in mid back 2 weeks, shoulder for 3 days. No pain with urination. Pt states she did have burning when she got into bed tonight.

## 2017-03-03 NOTE — ED Notes (Signed)
ED Provider at bedside. 

## 2017-03-04 NOTE — ED Provider Notes (Signed)
[facility] Emergency Department Provider Note _   First MD Initiated Contact with Patient 03/03/17 0230     (approximate)  I have reviewed the triage vital signs and the nursing notes.   HISTORY  Chief Complaint Shoulder Pain and Shortness of Breath   HPI Bailey Simpson is a 46 y.o. female G5 para 4 approximately [redacted] weeks pregnant presents to the emergency department with 2-week history of left lateral mid back pain.  Patient admits to dysuria and urinary frequency.  Patient denies any fever afebrile on presentation.  Patient denies any nausea or vomiting.  Patient denies any diarrhea or constipation.  She denies any vaginal discharge or bleeding.   Past Medical History:  Diagnosis Date  . Medical history non-contributory     Patient Active Problem List   Diagnosis Date Noted  . Advanced maternal age in multigravida, first trimester     History reviewed. No pertinent surgical history.  Prior to Admission medications   Medication Sig Start Date End Date Taking? Authorizing Provider  aspirin EC 81 MG tablet Take 81 mg by mouth daily.    [provider]  cephALEXin (KEFLEX) 500 MG capsule Take 1 capsule (500 mg total) by mouth 2 (two) times daily for 10 days. 03/03/17 03/13/17  Darci CurrentBrown, [place] N, MD  Prenatal Vit-Fe Fumarate-FA (PRENATAL MULTIVITAMIN) TABS tablet Take 1 tablet by mouth daily at 12 noon.    [provider]    Allergies Pork-derived products and Shrimp [shellfish allergy]  No family history on file.  Social History Social History   Tobacco Use  . Smoking status: Never Smoker  . Smokeless tobacco: Never Used  Substance Use Topics  . Alcohol use: No  . Drug use: No    Review of Systems Constitutional: No fever/chills Eyes: No visual changes. ENT: No sore throat. Cardiovascular: Denies chest pain. Respiratory: Denies shortness of breath. Gastrointestinal: No abdominal pain.  No nausea, no  vomiting.  No diarrhea.  No constipation. Genitourinary: Positive for dysuria. Musculoskeletal: Negative for neck pain.  Positive for back pain. Integumentary: Negative for rash. Neurological: Negative for headaches, focal weakness or numbness.   ____________________________________________   PHYSICAL EXAM:  VITAL SIGNS: ED Triage Vitals  Enc Vitals Group     BP 03/03/17 0057 114/66     Pulse Rate 03/03/17 0057 71     Resp 03/03/17 0057 18     Temp 03/03/17 0057 (!) 97.4 F (36.3 C)     Temp Source 03/03/17 0057 Oral     SpO2 03/03/17 0057 100 %     Weight 03/03/17 0057 98.9 kg (218 lb)     Height 03/03/17 0057 1.702 m (5\' 7" )     Head Circumference --      Peak Flow --      Pain Score 03/03/17 0246 8     Pain Loc --      Pain Edu? --      Excl. in GC? --     Constitutional: Alert and oriented. Well appearing and in no acute distress. Eyes: Conjunctivae are normal.  Head: Atraumatic. Mouth/Throat: Mucous membranes are moist.  Oropharynx non-erythematous. Neck: No stridor.  Cardiovascular: Normal rate, regular rhythm. Good peripheral circulation. Grossly normal heart sounds. Respiratory: Normal respiratory effort.  No retractions. Lungs CTAB. Gastrointestinal: Soft and nontender. No distention.  Musculoskeletal: No lower extremity tenderness nor edema. No gross deformities of extremities. Neurologic:  Normal speech and language. No gross focal neurologic deficits are appreciated.  Skin:  Skin is warm, dry and intact. No rash noted. Psychiatric: Mood and affect are normal. Speech and behavior are normal.  ____________________________________________   LABS (all labs ordered are listed, but only abnormal results are displayed)  Labs Reviewed  CBC - Abnormal; Notable for the following components:      Result Value   RBC 3.75 (*)    All other components within normal limits  COMPREHENSIVE METABOLIC PANEL - Abnormal; Notable for the following components:   Sodium 134  (*)    Glucose, Bld 113 (*)    Creatinine, Ser 0.39 (*)    Albumin 3.0 (*)    All other components within normal limits  URINALYSIS, COMPLETE (UACMP) WITH MICROSCOPIC - Abnormal; Notable for the following components:   Color, Urine YELLOW (*)    APPearance CLEAR (*)    Bacteria, UA MANY (*)    Squamous Epithelial / LPF 0-5 (*)    All other components within normal limits  TROPONIN I   __   Procedures   ____________________________________________   INITIAL IMPRESSION / ASSESSMENT AND PLAN / ED COURSE  As part of my medical decision making, I reviewed the following data within the electronic MEDICAL RECORD [id number]-year-old female presented with above-stated history and physical possible cystitis.  Urinalysis consistent with cystitis and as such patient given Keflex and will be prescribed same for home.  Given absence of abdominal pain and vaginal bleeding no ultrasound was performed. ____________________________________________  FINAL CLINICAL IMPRESSION(S) / ED DIAGNOSES  Final diagnoses:  Acute cystitis without hematuria     MEDICATIONS GIVEN DURING THIS VISIT:  Medications  cephALEXin (KEFLEX) capsule 500 mg (500 mg Oral Given 03/03/17 0359)     ED Discharge Orders        Ordered    cephALEXin (KEFLEX) 500 MG capsule  2 times daily     03/03/17 [id number]       Note:  This document was prepared using Dragon voice recognition software and may include unintentional dictation errors.    Darci CurrentBrown, [place] N, MD 03/04/17 [phone]

## 2017-03-09 ENCOUNTER — Encounter: Payer: Self-pay | Admitting: Family Medicine

## 2017-03-23 NOTE — L&D Delivery Note (Signed)
Delivery Note  First Stage: Labor onset: 1355 Induction : Pitocin and AROM Analgesia /Anesthesia intrapartum: Fentanyl x 1 dose, then epidural AROM at 1355, clear fluid  Second Stage: Complete dilation at 1859 Onset of pushing at 1903 FHR second stage Cat II, variable decels.   Delivery of a viable female 06/07/17 at  1907 by CNM, delivery of fetal head in OA position with restitution to LOA. Tight nuchal cord x1;  Right anterior then posterior shoulders delivered easily with gentle downward traction. Baby placed on mom's chest, and attended to by peds.  Cord double clamped after cessation of pulsation, cut by FOB Cord blood sample collected   Third Stage: Placenta delivered spontanteously intact with 3 VC @ 1917. Placenta disposition: routine Uterine tone firm / bleeding small  1st deg laceration identified  Anesthesia for repair: epidural Repaired in usual fashion with 2-0 Vicryl CT-1 and 3-0 Vicryl SH x 1.  Est. Blood Loss (mL): 200  Complications: none  Mom to postpartum.  Baby to Couplet care / Skin to Skin.  Newborn: Birth Weight: pending  Apgar Scores: 8/9 Feeding planned: breast

## 2017-03-25 ENCOUNTER — Other Ambulatory Visit: Payer: Self-pay | Admitting: *Deleted

## 2017-03-25 DIAGNOSIS — O09523 Supervision of elderly multigravida, third trimester: Secondary | ICD-10-CM

## 2017-03-29 ENCOUNTER — Ambulatory Visit
Admission: RE | Admit: 2017-03-29 | Discharge: 2017-03-29 | Disposition: A | Discharge status: Home / Self Care | Payer: Medicaid Other | Source: Ambulatory Visit | Attending: Maternal and Fetal Medicine | Admitting: Maternal and Fetal Medicine

## 2017-03-29 DIAGNOSIS — O09523 Supervision of elderly multigravida, third trimester: Secondary | ICD-10-CM

## 2017-04-05 ENCOUNTER — Encounter: Payer: Medicaid Other | Attending: Advanced Practice Midwife | Admitting: *Deleted

## 2017-04-05 ENCOUNTER — Encounter: Payer: Self-pay | Admitting: *Deleted

## 2017-04-05 VITALS — BP 100/68 | Ht 67.0 in | Wt 235.0 lb

## 2017-04-05 DIAGNOSIS — O24410 Gestational diabetes mellitus in pregnancy, diet controlled: Secondary | ICD-10-CM

## 2017-04-05 DIAGNOSIS — O09529 Supervision of elderly multigravida, unspecified trimester: Secondary | ICD-10-CM | POA: Insufficient documentation

## 2017-04-05 DIAGNOSIS — Z713 Dietary counseling and surveillance: Secondary | ICD-10-CM | POA: Diagnosis not present

## 2017-04-05 DIAGNOSIS — Z3A00 Weeks of gestation of pregnancy not specified: Secondary | ICD-10-CM | POA: Insufficient documentation

## 2017-04-05 DIAGNOSIS — O24419 Gestational diabetes mellitus in pregnancy, unspecified control: Secondary | ICD-10-CM | POA: Insufficient documentation

## 2017-04-05 NOTE — Progress Notes (Signed)
Diabetes Self-Management Education  Visit Type: First/Initial  Appt. Start Time: 1340 Appt. End Time: 1525  04/05/2017  Bailey Simpson, identified by name and date of birth, is a 47 y.o. female with a diagnosis of Diabetes: Gestational Diabetes.   ASSESSMENT  Blood pressure 100/68, height 5\' 7"  (1.702 m), weight 235 lb (106.6 kg), last menstrual period 09/11/2016, unknown if currently breastfeeding. Body mass index is 36.81 kg/m.  Diabetes Self-Management Education - 04/05/17 1552      Visit Information   Visit Type  First/Initial      Initial Visit   Diabetes Type  Gestational Diabetes    Are you currently following a meal plan?  No    Are you taking your medications as prescribed?  Yes    Date Diagnosed  2 weeks ago      Health Coping   How would you rate your overall health?  Fair      Psychosocial Assessment   Patient Belief/Attitude about Diabetes  Other (comment) "worried"    Self-care barriers  English as a second language    Self-management support  Doctor's office    Other persons present  Interpreter    Patient Concerns  Nutrition/Meal planning;Glycemic Control;Monitoring    Special Needs  Other (comment) Materials in Spanish    Preferred Learning Style  Auditory    Learning Readiness  Ready    How often do you need to have someone help you when you read instructions, pamphlets, or other written materials from your doctor or pharmacy?  1 - Never If written in Spanish    What is the last grade level you completed in school?  12th       Pre-Education Assessment   Patient understands the diabetes disease and treatment process.  Needs Instruction    Patient understands incorporating nutritional management into lifestyle.  Needs Instruction    Patient undertands incorporating physical activity into lifestyle.  Needs Instruction    Patient understands using medications safely.  Needs Instruction    Patient understands monitoring blood glucose,  interpreting and using results  Needs Review    Patient understands prevention, detection, and treatment of acute complications.  Needs Instruction    Patient understands prevention, detection, and treatment of chronic complications.  Needs Instruction    Patient understands how to develop strategies to address psychosocial issues.  Needs Instruction    Patient understands how to develop strategies to promote health/change behavior.  Needs Instruction      Complications   How often do you check your blood sugar?  3-4 times/day Pt has a meter and started tesing her blood sugars last week - 3 x day.     Fasting Blood glucose range (mg/dL)  16-109 FBG's 60-454 mg/dL    Postprandial Blood glucose range (mg/dL)  09-811;914-782 pp's 956-213 mg/dL    Have you had a dilated eye exam in the past 12 months?  No    Have you had a dental exam in the past 12 months?  Yes    Are you checking your feet?  No      Dietary Intake   Breakfast  2 eggs, wheat toast x 2; juice or milk; cereal and milk    Snack (morning)  rarely has snacks - sometimes fruit    Lunch  chicken soup; fruit    Dinner  steak, chicken, chicken soup, potaotes, carrots, rice, tortillas, beans, green beans, peas, salads with tomatoes, onions    Beverage(s)  water, juice, milk,  occasional coffee      Exercise   Exercise Type  Light (walking / raking leaves)    How many days per week to you exercise?  3    How many minutes per day do you exercise?  30    Total minutes per week of exercise  90      Patient Education   Previous Diabetes Education  No    Disease state   Definition of diabetes, type 1 and 2, and the diagnosis of diabetes    Nutrition management   Role of diet in the treatment of diabetes and the relationship between the three main macronutrients and blood glucose level;Reviewed blood glucose goals for pre and post meals and how to evaluate the patients' food intake on their blood glucose level.    Physical activity and  exercise   Role of exercise on diabetes management, blood pressure control and cardiac health.    Monitoring  Purpose and frequency of SMBG.;Taught/discussed recording of test results and interpretation of SMBG.;Ketone testing, when, how.    Chronic complications  Relationship between chronic complications and blood glucose control    Psychosocial adjustment  Identified and addressed patients feelings and concerns about diabetes    Preconception care  Pregnancy and GDM  Role of pre-pregnancy blood glucose control on the development of the fetus;Role of family planning for patients with diabetes;Reviewed with patient blood glucose goals with pregnancy      Individualized Goals (developed by patient)   Reducing Risk  Improve blood sugars Prevent diabetes complications     Outcomes   Expected Outcomes  Demonstrated interest in learning. Expect positive outcomes       Individualized Plan for Diabetes Self-Management Training:   Learning Objective:  Patient will have a greater understanding of diabetes self-management. Patient education plan is to attend individual and/or group sessions per assessed needs and concerns.   Plan:   Patient Instructions  Read booklet on Gestational Diabetes Follow Gestational Meal Planning Guidelines Avoid fruit juices No cold cereal or fruit for breakfast Complete a 3 Day Food Record and bring to next appointment Check blood sugars 4 x day - before breakfast and 2 hrs after every meal and record  Bring blood sugar log to all appointments Call MD for prescription for meter strips and lancets Purchase urine ketone strips if ordered by MD and check urine ketones every am:  If + increase bedtime snack to 1 protein and 2 carbohydrate servings Walk 20-30 minutes at least 5 x week if permitted by MD  Expected Outcomes:  Demonstrated interest in learning. Expect positive outcomes  Education material provided:  Gestational Booklet Gestational Meal Planning  Guidelines Viewed Gestational Diabetes Video 3 Day Food Record Goals for a Healthy Pregnancy  If problems or questions, patient to contact team via:  Sharion SettlerSheila Shotwell, RN, CCM, CDE [phone]  Future DSME appointment: April 12, 2017 with the dietitian

## 2017-04-05 NOTE — Patient Instructions (Signed)
Read booklet on Gestational Diabetes Follow Gestational Meal Planning Guidelines Avoid fruit juices No cold cereal or fruit for breakfast Complete a 3 Day Food Record and bring to next appointment Check blood sugars 4 x day - before breakfast and 2 hrs after every meal and record  Bring blood sugar log to all appointments Call MD for prescription for meter strips and lancets Purchase urine ketone strips if ordered by MD and check urine ketones every am:  If + increase bedtime snack to 1 protein and 2 carbohydrate servings Walk 20-30 minutes at least 5 x week if permitted by MD

## 2017-04-08 ENCOUNTER — Emergency Department
Admission: EM | Admit: 2017-04-08 | Discharge: 2017-04-08 | Disposition: A | Discharge status: Home / Self Care | Payer: Medicaid Other | Attending: Emergency Medicine | Admitting: Emergency Medicine

## 2017-04-08 ENCOUNTER — Encounter: Payer: Self-pay | Admitting: Intensive Care

## 2017-04-08 DIAGNOSIS — Z7982 Long term (current) use of aspirin: Secondary | ICD-10-CM | POA: Insufficient documentation

## 2017-04-08 DIAGNOSIS — J069 Acute upper respiratory infection, unspecified: Secondary | ICD-10-CM

## 2017-04-08 DIAGNOSIS — R0981 Nasal congestion: Secondary | ICD-10-CM | POA: Diagnosis present

## 2017-04-08 DIAGNOSIS — R05 Cough: Secondary | ICD-10-CM | POA: Diagnosis not present

## 2017-04-08 DIAGNOSIS — B9789 Other viral agents as the cause of diseases classified elsewhere: Secondary | ICD-10-CM

## 2017-04-08 DIAGNOSIS — Z79899 Other long term (current) drug therapy: Secondary | ICD-10-CM | POA: Insufficient documentation

## 2017-04-08 MED ORDER — ALBUTEROL SULFATE HFA 108 (90 BASE) MCG/ACT IN AERS: 2.0000 | INHALATION_SPRAY | Freq: Four times a day (QID) | RESPIRATORY_TRACT | 0 refills | Status: AC | PRN

## 2017-04-08 NOTE — ED Triage Notes (Addendum)
Patient c/o flu like symptoms. Reports cough, congestion in chest, runny nose. Denies fevers at home. Pt took Tylenol around 4pm today. Ambulatory with no problems. Patient reports she is [redacted] weeks pregnant. Denies abdominal pain or abnormal d/c. Reports just being here for cough

## 2017-04-08 NOTE — ED Provider Notes (Signed)
[facility] Emergency Department Provider Note  ____________________________________________  Time seen: Approximately 6:22 PM  I have reviewed the triage vital signs and the nursing notes.   HISTORY  Chief Complaint Cough    HPI Bailey StaggersJaneth Palomino Coronado is a 47 y.o. female that presents to the emergency department for evaluation of nasal congestion, rhinorrhea, cough for 1 day.  Patient is coughing up clear phlegm.  She received her flu vaccine this year. She is [redacted] weeks pregnant.  Has an appointment with her OB on Tuesday. No fever, muscle aches, vomiting, abdominal pain.  Past Medical History:  Diagnosis Date  . Gestational diabetes   . Medical history non-contributory     Patient Active Problem List   Diagnosis Date Noted  . Advanced maternal age in multigravida, first trimester     History reviewed. No pertinent surgical history.  Prior to Admission medications   Medication Sig Start Date End Date Taking? Authorizing Provider  albuterol (PROVENTIL HFA;VENTOLIN HFA) 108 (90 Base) MCG/ACT inhaler Inhale 2 puffs into the lungs every 6 (six) hours as needed for wheezing or shortness of breath. 04/08/17   Enid DerryWagner, Shahid Flori, PA-C  aspirin EC 81 MG tablet Take 81 mg by mouth daily.    [provider]  calcium carbonate (TUMS EX) 750 MG chewable tablet Chew 1 tablet by mouth daily as needed for heartburn.    [provider]  Prenatal Vit-Fe Fumarate-FA (PRENATAL MULTIVITAMIN) TABS tablet Take 1 tablet by mouth daily at 12 noon.    [provider]  PRESCRIPTION MEDICATION Take 1 capsule by mouth daily.    [provider]    Allergies Pork-derived products and Shrimp [shellfish allergy]  Family History  Problem Relation Age of Onset  . Diabetes Sister     Social History Social History   Tobacco Use  . Smoking status: Never Smoker  . Smokeless tobacco: Never Used  Substance Use Topics  . Alcohol use: No  . Drug  use: No     Review of Systems  Constitutional: No fever/chills ENT: Positive for congestion and rhinorrhea. Cardiovascular: No chest pain. Respiratory: Positive for cough. No SOB. Gastrointestinal: No abdominal pain.  No nausea, no vomiting.  No diarrhea.  No constipation. Musculoskeletal: Negative for musculoskeletal pain. Skin: Negative for rash, abrasions, lacerations, ecchymosis. Neurological: Negative for headaches.   ____________________________________________   PHYSICAL EXAM:  VITAL SIGNS: ED Triage Vitals [04/08/17 1748]  Enc Vitals Group     BP (!) 144/58     Pulse Rate 83     Resp 16     Temp 98.5 F (36.9 C)     Temp Source Oral     SpO2 99 %     Weight 235 lb (106.6 kg)     Height 5\' 7"  (1.702 m)     Head Circumference      Peak Flow      Pain Score 8     Pain Loc      Pain Edu?      Excl. in GC?      Constitutional: Alert and oriented. Well appearing and in no acute distress. Eyes: Conjunctivae are normal. PERRL. EOMI. No discharge. Head: Atraumatic. ENT: No frontal and maxillary sinus tenderness.      Ears: Tympanic membranes pearly gray with good landmarks. No discharge.      Nose: Mild congestion/rhinnorhea.      Mouth/Throat: Mucous membranes are moist. Oropharynx non-erythematous. Tonsils not enlarged. No exudates. Uvula midline. Neck: No stridor.  Hematological/Lymphatic/Immunilogical: No cervical lymphadenopathy. Cardiovascular: Normal rate, regular rhythm.  Good peripheral circulation. Respiratory: Normal respiratory effort without tachypnea or retractions. Lungs CTAB. Good air entry to the bases with no decreased or absent breath sounds. Gastrointestinal: Bowel sounds 4 quadrants. Soft and nontender to palpation. No guarding or rigidity. No palpable masses. No distention. Musculoskeletal: Full range of motion to all extremities. No gross deformities appreciated. Neurologic:  Normal speech and language. No gross focal neurologic deficits  are appreciated.  Skin:  Skin is warm, dry and intact. No rash noted.  ____________________________________________   LABS (all labs ordered are listed, but only abnormal results are displayed)  Labs Reviewed - No data to display ____________________________________________  EKG   ____________________________________________  RADIOLOGY   No results found.  ____________________________________________    PROCEDURES  Procedure(s) performed:    Procedures    Medications - No data to display   ____________________________________________   INITIAL IMPRESSION / ASSESSMENT AND PLAN / ED COURSE  Pertinent labs & imaging results that were available during my care of the patient were reviewed by me and considered in my medical decision making (see chart for details).  Review of the [place] CSRS was performed in accordance of the [facility] prior to dispensing any controlled drugs.   Patient's diagnosis is consistent with viral URI with cough. Vital signs and exam are reassuring. Patient appears well and is staying well hydrated. Patient feels comfortable going home. Patient will be discharged home with prescriptions for albuterol inhaler. Patient is to follow up with PCP as needed or otherwise directed. Patient is given ED precautions to return to the ED for any worsening or new symptoms.     ____________________________________________  FINAL CLINICAL IMPRESSION(S) / ED DIAGNOSES  Final diagnoses:  Viral URI with cough      NEW MEDICATIONS STARTED DURING THIS VISIT:  ED Discharge Orders        Ordered    albuterol (PROVENTIL HFA;VENTOLIN HFA) 108 (90 Base) MCG/ACT inhaler  Every 6 hours PRN     04/08/17 1843          This chart was dictated using voice recognition software/Dragon. Despite best efforts to proofread, errors can occur which can change the meaning. Any change was purely unintentional.    Enid Derry, PA-C 04/08/17 2241    Rockne Menghini, MD 04/08/17 [phone]

## 2017-04-08 NOTE — ED Notes (Signed)
See triage note.states she developed cough and some sinus pressure yesterday  No fever  States cough is occasionally prod    Afebrile on arrival

## 2017-04-12 ENCOUNTER — Encounter: Payer: Self-pay | Admitting: Dietician

## 2017-04-12 ENCOUNTER — Encounter: Payer: Medicaid Other | Admitting: Dietician

## 2017-04-12 VITALS — BP 110/76 | Ht 67.0 in | Wt 235.9 lb

## 2017-04-12 DIAGNOSIS — O24410 Gestational diabetes mellitus in pregnancy, diet controlled: Secondary | ICD-10-CM

## 2017-04-12 DIAGNOSIS — Z713 Dietary counseling and surveillance: Secondary | ICD-10-CM | POA: Diagnosis not present

## 2017-04-12 NOTE — Patient Instructions (Signed)
   Drink water, Crystal Light, "Healthy Balance" juice (jugo), "Propel" water, "Nestle Splash" water.   Eat every 3-4 hours during the day.

## 2017-04-12 NOTE — Progress Notes (Signed)
   Patient's BG record indicates BGs have been fluctuating over the past week; patient has also been ill with flu symptoms. Fasting BGs have ranged 95-1`15, most recorded results have been either 95 or 98. Post-meal BGs have ranged 93-160.   Patient's food recall indicates smaller amounts of food and some skipped meals due to poor appetite with feeling ill.    Provided 1900kcal meal plan, and wrote individualized menus based on patient's food preferences. Advised eating small amounts of food often when not feeling well and provided examples of soup, crackers and cheese, toast and peanut butter for light meals.   Instructed patient on food safety, including avoidance of Listeriosis, and limiting mercury from fish.  Discussed importance of maintaining healthy lifestyle habits to reduce risk of Type 2 DM as well as Gestational DM with any future pregnancies.  Advised patient to use any remaining testing supplies to test some BGs after delivery, and to have BG tested ideally annually, as well as prior to attempting future pregnancies.

## 2017-04-22 ENCOUNTER — Other Ambulatory Visit: Payer: Self-pay | Admitting: *Deleted

## 2017-04-22 ENCOUNTER — Inpatient Hospital Stay
Admission: RE | Admit: 2017-04-22 | Discharge: 2017-04-22 | Disposition: A | Discharge status: Home / Self Care | Payer: Medicaid Other | Attending: Obstetrics and Gynecology | Admitting: Obstetrics and Gynecology

## 2017-04-22 DIAGNOSIS — O09523 Supervision of elderly multigravida, third trimester: Secondary | ICD-10-CM

## 2017-04-22 DIAGNOSIS — Z79899 Other long term (current) drug therapy: Secondary | ICD-10-CM | POA: Diagnosis not present

## 2017-04-22 DIAGNOSIS — O24419 Gestational diabetes mellitus in pregnancy, unspecified control: Secondary | ICD-10-CM | POA: Diagnosis not present

## 2017-04-22 DIAGNOSIS — Z3A32 32 weeks gestation of pregnancy: Secondary | ICD-10-CM | POA: Diagnosis not present

## 2017-04-22 DIAGNOSIS — Z0379 Encounter for other suspected maternal and fetal conditions ruled out: Secondary | ICD-10-CM | POA: Diagnosis not present

## 2017-04-22 DIAGNOSIS — Z369 Encounter for antenatal screening, unspecified: Secondary | ICD-10-CM | POA: Diagnosis present

## 2017-04-22 NOTE — Discharge Instructions (Signed)
Diabetes mellitus y nutrición  Diabetes Mellitus and Nutrition  Si sufre de diabetes (diabetes mellitus), es muy importante tener hábitos alimenticios saludables debido a que sus niveles de azúcar en la sangre (glucosa) se ven afectados en gran medida por lo que come y bebe. Comer alimentos saludables en las cantidades adecuadas, aproximadamente a la misma hora todos los días, lo ayudará a:  · Controlar la glucemia.  · Disminuir el riesgo de sufrir una enfermedad cardíaca.  · Mejorar la presión arterial.  · Alcanzar o mantener un peso saludable.    Todas las personas que sufren de diabetes son diferentes y cada una tiene necesidades diferentes en cuanto a un plan de alimentación. El médico puede recomendarle que trabaje con un especialista en dietas y nutrición (nutricionista) para elaborar el mejor plan para usted. Su plan de alimentación puede variar según factores como:  · Las calorías que necesita.  · Los medicamentos que toma.  · Su peso.  · Sus niveles de glucemia, presión arterial y colesterol.  · Su nivel de actividad.  · Otras afecciones que tenga, como enfermedades cardíacas o renales.    ¿Cómo me afectan los carbohidratos?  Los carbohidratos afectan el nivel de glucemia más que cualquier otro tipo de alimento. La ingesta de carbohidratos naturalmente aumenta la cantidad glucosa en la sangre. El recuento de carbohidratos es un método destinado a llevar un registro de la cantidad de carbohidratos que se ingieren. El recuento de carbohidratos es importante para mantener la glucemia a un nivel saludable, en especial si utiliza insulina o toma determinados medicamentos por vía oral para la diabetes.  Es importante saber la cantidad de carbohidratos que se pueden ingerir en cada comida sin correr ningún riesgo. Esto es diferente en cada persona. El nutricionista puede ayudarlo a calcular la cantidad de carbohidratos que debe ingerir en cada comida y colación.   Los alimentos que contienen carbohidratos incluyen:  · Pan, cereal, arroz, pasta y galletas.  · Papas y maíz.  · Guisantes, frijoles y lentejas.  · Leche y yogur.  · Frutas y jugo.  · Postres, como pasteles, galletitas, helado y caramelos.    ¿Cómo me afecta el alcohol?  El alcohol puede provocar disminuciones súbitas de la glucemia (hipoglucemia), en especial si utiliza insulina o toma determinados medicamentos por vía oral para la diabetes. La hipoglucemia es una afección potencialmente mortal. Los síntomas de la hipoglucemia (somnolencia, mareos y confusión) son similares a los síntomas de haber consumido demasiado alcohol.  Si el médico afirma que el alcohol es seguro para usted, siga estas pautas:  · Limite el consumo de alcohol a no más de 1 medida por día si es mujer y no está embarazada, y a 2 medidas si es hombre. Una medida equivale a 12 oz (355 ml) de cerveza, 5 oz (148 ml) de vino o 1½ oz (44 ml) de bebidas de alta graduación alcohólica.  · No beba con el estómago vacío.  · Manténgase hidratado con agua, gaseosas dietéticas o té helado sin azúcar.  · Tenga en cuenta que las gaseosas comunes, los jugos y otros refrescos pueden contener mucha azúcar y se deben contar como carbohidratos.    Consejos para seguir este plan  Leer las etiquetas de los alimentos  · Comience por controlar el tamaño de la porción en la etiqueta. La cantidad de calorías, carbohidratos, grasas y otros nutrientes mencionados en la etiqueta se basan en una porción del alimento. Muchos alimentos contienen más de una porción por envase.  · Verifique la cantidad total de gramos (g)   de carbohidratos totales en una porción. Puede calcular la cantidad de porciones de carbohidratos al dividir el total de carbohidratos por 15. Por ejemplo, si un alimento posee un total de 30 g de carbohidratos, equivale a 2 porciones de carbohidratos.  · Verifique la cantidad de gramos (g) de grasas saturadas y grasas trans  en una porción. Escoja alimentos que no contengan grasa o que tengan un bajo contenido.  · Controle la cantidad de miligramos (mg) de sodio en una porción. La mayoría de las personas deben limitar la ingesta de sodio total a menos de 2300 mg por día.  · Siempre consulte la información nutricional de los alimentos etiquetados como “con bajo contenido de grasa” o “sin grasa”. Estos alimentos pueden ser más altos en azúcar agregada o en carbohidratos refinados y deben evitarse.  · Hable con el nutricionista para identificar sus objetivos diarios en cuanto a los nutrientes mencionados en la etiqueta.  De compras  · Evite comprar alimentos procesados, enlatados o prehechos. Estos alimentos tienden a tener mayor cantidad de grasa, sodio y azúcar agregada.  · Compre en la zona exterior de la tienda de comestibles. Esta incluye frutas y vegetales frescos, granos a granel, carnes frescas y productos lácteos frescos.  Cocción  · Utilice métodos de cocción a baja temperatura, como hornear, en lugar de métodos de cocción a alta temperatura, como freír en abundante aceite.  · Cocine con aceites saludables, como el aceite de oliva, canola o girasol.  · Evite cocinar con manteca, crema o carnes con alto contenido de grasa.  Planificación de las comidas  · Consuma las comidas y las colaciones de forma regular, preferentemente a la misma hora todos los días. Evite pasar largos períodos de tiempo sin comer.  · Consuma alimentos ricos en fibra, como frutas frescas, verduras, frijoles y cereales integrales. Consulte al nutricionista sobre cuántas porciones de carbohidratos puede consumir en cada comida.  · Consuma entre 4 y 6 onzas de proteínas magras por día, como carnes magras, pollo, pescado, huevos o tofu. 1 onza equivale a 1 onza de carne, pollo o pescado, 1 huevo, o 1/4 taza de tofu.  · Coma algunos alimentos por día que contengan grasas saludables, como aguacates, frutos secos, semillas y pescado.  Estilo de vida     · Controle su nivel de glucemia con regularidad.  · Haga ejercicio al menos 30 minutos, 5 días o más por semana, o como se lo haya indicado el médico.  · Tome los medicamentos como se lo haya indicado el médico.  · No consuma ningún producto que contenga nicotina o tabaco, como cigarrillos y cigarrillos electrónicos. Si necesita ayuda para dejar de fumar, consulte al médico.  · Trabaje con un asesor o instructor en diabetes para identificar estrategias para controlar el estrés y cualquier desafío emocional y social.  ¿Cuáles son algunas de las preguntas que puedo hacerle a mi médico?  · ¿Es necesario que me reúna con un instructor en diabetes?  · ¿Es necesario que me reúna con un nutricionista?  · ¿A qué número puedo llamar si tengo preguntas?  · ¿Cuáles son los mejores momentos para controlar la glucemia?  Dónde encontrar más información:  · Asociación Americana de la Diabetes (American Diabetes Association): diabetes.org/food-and-fitness/food  · Academia de Nutrición y Dietética (Academy of Nutrition and Dietetics): www.eatright.org/resources/health/diseases-and-conditions/diabetes  · Instituto Nacional de la Diabetes y las Enfermedades Digestivas y Renales (National Institute of Diabetes and Digestive and Kidney Diseases) (Institutos Nacionales de Salud, NIH): www.niddk.nih.gov/health-information/diabetes/overview/diet-eating-physical-activity  Resumen  · Un plan de alimentación saludable   lo ayudará a controlar la glucemia y mantener un estilo de vida saludable.  · Trabajar con un especialista en dietas y nutrición (nutricionista) puede ayudarlo a elaborar el mejor plan de alimentación para usted.  · Tenga en cuenta que los carbohidratos y el alcohol tienen efectos inmediatos en sus niveles de glucemia. Es importante contar los carbohidratos y consumir alcohol con prudencia.  Esta información no tiene como fin reemplazar el consejo del médico. Asegúrese de hacerle al médico cualquier pregunta que tenga.   Document Released: 06/16/2007 Document Revised: 06/29/2016 Document Reviewed: 06/29/2016  Elsevier Interactive Patient Education © 2018 Elsevier Inc.

## 2017-04-22 NOTE — OB Triage Note (Addendum)
Pt presents for NST.Pt denies VB, LOF, and states positive fetal movement. Pt states she is having ctx every 20min and last about 2 minutes but it only happens at night. Pt rates ctx pain 7/10 scale.Pt denies headache, blurred vision or upper GI pain. Fetal monitors applied and assessing.

## 2017-04-22 NOTE — Progress Notes (Signed)
Patient ID: Bailey StaggersJaneth Palomino Simpson, female   DOB: April 03, 1970, 47 y.o.   MRN: [id number]  Bailey Simpson is a 47 y.o. female. She is at 5422w6d gestation. Patient's last menstrual period was 09/11/2016 (approximate). Estimated Date of Delivery: 06/11/17  Prenatal care site: [facility]   Chief complaint:NST for A2GDM, AMA  Location: Birthplace Onset/timing: No UC's noted Duration:N/A Quality: N/A Severity:N/A Aggravating or alleviating conditions:None Associated signs/symptoms:None Context:  S: Resting comfortably. no CTX, no VB.no LOF,  Active fetal movement.   Maternal Medical History:   Past Medical History:  Diagnosis Date  . Gestational diabetes   . Medical history non-contributory     Past Surgical History:  Procedure Laterality Date  . NO PAST SURGERIES      Allergies  Allergen Reactions  . Pork-Derived Products Swelling  . Shrimp [Shellfish Allergy] Itching    Prior to Admission medications   Medication Sig Start Date End Date Taking? Authorizing Provider  aspirin EC 81 MG tablet Take 81 mg by mouth daily.   Yes [provider]  calcium carbonate (TUMS EX) 750 MG chewable tablet Chew 1 tablet by mouth daily as needed for heartburn.   Yes [provider]  Prenatal Vit-Fe Fumarate-FA (PRENATAL MULTIVITAMIN) TABS tablet Take 1 tablet by mouth daily at 12 noon.   Yes [provider]  PRESCRIPTION MEDICATION Take 1 capsule by mouth daily.   Yes [provider]  albuterol (PROVENTIL HFA;VENTOLIN HFA) 108 (90 Base) MCG/ACT inhaler Inhale 2 puffs into the lungs every 6 (six) hours as needed for wheezing or shortness of breath. Patient not taking: Reported on 04/22/2017 04/08/17   Enid DerryWagner, Ashley, PA-C     Social History: She  reports that  has never smoked. she has never used smokeless tobacco. She reports that she does not drink alcohol or use drugs.  Family History: family history includes Diabetes in her sister.  no history  of gyn cancers  Review of Systems: A full review of systems was performed and negative except as noted in the HPI.     O:  BP 118/61   Pulse 79   Temp 98.4 F (36.9 C) (Oral)   Resp 18   Ht 5\' 7"  (1.702 m)   Wt 108.4 kg (239 lb)   LMP 09/11/2016 (Approximate)   BMI 37.43 kg/m  No results found for this or any previous visit (from the past 48 hour(s)).   Constitutional: NAD, AAOx3  HE/ENT: extraocular movements grossly intact, moist mucous membranes Resp reg and non-labored Abd: gravid     Ext: Non-tender, Nonedmeatous   Psych: mood appropriate, speech normal Pelvic deferred  NST: Reactive NST with 2 accels 15 x 15 BPM Baseline: 150 Variability: moderate Accelerations present x >2 Decelerations absent Time 20mins    A/P: 47 y.o. 1322w6d here for antenatal surveillance for A2GDM and AMA  Labor: not present.   Fetal Wellbeing: Reassuring Cat 1 tracing.  Reactive NST   D/c home stable, precautions reviewed, follow-up as scheduled.  Continue meds (metformin 500 mg as prescribed) ----- Sharee Pimplearon W.Tyreek Clabo, RN, MSN, CNM, FNP [facility], Department of OB/GYN [facility]

## 2017-04-26 ENCOUNTER — Observation Stay
Admit: 2017-04-26 | Discharge: 2017-04-26 | Disposition: A | Discharge status: Home / Self Care | Payer: Medicaid Other | Attending: Obstetrics and Gynecology | Admitting: Obstetrics and Gynecology

## 2017-04-26 ENCOUNTER — Ambulatory Visit
Admission: RE | Admit: 2017-04-26 | Discharge: 2017-04-26 | Disposition: A | Discharge status: Home / Self Care | Payer: Medicaid Other | Source: Ambulatory Visit | Attending: Obstetrics and Gynecology | Admitting: Obstetrics and Gynecology

## 2017-04-26 ENCOUNTER — Other Ambulatory Visit: Payer: Self-pay

## 2017-04-26 DIAGNOSIS — O09523 Supervision of elderly multigravida, third trimester: Principal | ICD-10-CM | POA: Diagnosis present

## 2017-04-26 DIAGNOSIS — O24419 Gestational diabetes mellitus in pregnancy, unspecified control: Secondary | ICD-10-CM | POA: Insufficient documentation

## 2017-04-26 DIAGNOSIS — Z3A33 33 weeks gestation of pregnancy: Secondary | ICD-10-CM | POA: Insufficient documentation

## 2017-04-26 DIAGNOSIS — O09521 Supervision of elderly multigravida, first trimester: Secondary | ICD-10-CM | POA: Diagnosis present

## 2017-04-26 NOTE — Discharge Summary (Signed)
Bailey Simpson is a 47 y.o. female. She is at 2835w3d gestation. Patient's last menstrual period was 09/11/2016 (approximate). Estimated Date of Delivery: 06/11/17  Prenatal care site: [facility]  Chief Complaint: high risk pregnancy, need for antepartum surveillance  S: Resting comfortably. no CTX, no VB.no LOF,  Active fetal movement.    Maternal Medical History:   Past Medical History:  Diagnosis Date  . Gestational diabetes   . Medical history non-contributory     Past Surgical History:  Procedure Laterality Date  . NO PAST SURGERIES      Allergies  Allergen Reactions  . Pork-Derived Products Swelling  . Shrimp [Shellfish Allergy] Itching    Prior to Admission medications   Medication Sig Start Date End Date Taking? Authorizing Provider  aspirin EC 81 MG tablet Take 81 mg by mouth daily.   Yes [provider]  calcium carbonate (TUMS EX) 750 MG chewable tablet Chew 1 tablet by mouth daily as needed for heartburn.   Yes [provider]  Prenatal Vit-Fe Fumarate-FA (PRENATAL MULTIVITAMIN) TABS tablet Take 1 tablet by mouth daily at 12 noon.   Yes [provider]  PRESCRIPTION MEDICATION Take 1 capsule by mouth daily.   Yes [provider]  albuterol (PROVENTIL HFA;VENTOLIN HFA) 108 (90 Base) MCG/ACT inhaler Inhale 2 puffs into the lungs every 6 (six) hours as needed for wheezing or shortness of breath. Patient not taking: Reported on 04/22/2017 04/08/17   Enid DerryWagner, Ashley, PA-C     Social History: She  reports that  has never smoked. she has never used smokeless tobacco. She reports that she does not drink alcohol or use drugs.  Family History: family history includes Diabetes in her sister.  Review of Systems: A full review of systems was performed and negative except as noted in the HPI.     O:  BP 110/65 (BP Location: Right Arm)   Pulse 78   Temp 98.2 F (36.8 C) (Oral)   Resp 14   Ht 5\' 7"  (1.702 m)   Wt 232 lb (105.2 kg)    LMP 09/11/2016 (Approximate)   BMI 36.34 kg/m  No results found for this or any previous visit (from the past 48 hour(s)).   Constitutional: NAD, AAOx3  HE/ENT: extraocular movements grossly intact, moist mucous membranes CV: RRR PULM: nl respiratory effort, CTABL     Abd: gravid, non-tender, non-distended, soft      Ext: Non-tender, Nonedematous   Psych: mood appropriate, speech normal Pelvic: deferred  Baseline: 145bpm Variability: moderate Accelerations present x >2 Decelerations absent  Time 20mins    A/P: 47 y.o. 6235w3d with high risk pregnancy and antepartum surveillance for advanced maternal age and GDMA1  Labor: not present.   Fetal Wellbeing: Reassuring Cat 1 tracing.  Reactive NST   D/c home stable, precautions reviewed, follow-up as scheduled.   ----- Hebert Dooling A, CNM 04/26/17 4:40 PM

## 2017-04-29 ENCOUNTER — Observation Stay
Admission: RE | Admit: 2017-04-29 | Discharge: 2017-04-29 | Disposition: A | Discharge status: Home / Self Care | Payer: Medicaid Other | Attending: Obstetrics & Gynecology | Admitting: Obstetrics & Gynecology

## 2017-04-29 DIAGNOSIS — O09523 Supervision of elderly multigravida, third trimester: Secondary | ICD-10-CM | POA: Insufficient documentation

## 2017-04-29 DIAGNOSIS — O09893 Supervision of other high risk pregnancies, third trimester: Secondary | ICD-10-CM | POA: Insufficient documentation

## 2017-04-29 DIAGNOSIS — Z91013 Allergy to seafood: Secondary | ICD-10-CM | POA: Insufficient documentation

## 2017-04-29 DIAGNOSIS — Z7982 Long term (current) use of aspirin: Secondary | ICD-10-CM | POA: Insufficient documentation

## 2017-04-29 DIAGNOSIS — Z7984 Long term (current) use of oral hypoglycemic drugs: Secondary | ICD-10-CM | POA: Diagnosis not present

## 2017-04-29 DIAGNOSIS — O24415 Gestational diabetes mellitus in pregnancy, controlled by oral hypoglycemic drugs: Secondary | ICD-10-CM | POA: Diagnosis not present

## 2017-04-29 DIAGNOSIS — Z91018 Allergy to other foods: Secondary | ICD-10-CM | POA: Diagnosis not present

## 2017-04-29 DIAGNOSIS — Z3A33 33 weeks gestation of pregnancy: Secondary | ICD-10-CM | POA: Diagnosis not present

## 2017-04-29 DIAGNOSIS — Z79899 Other long term (current) drug therapy: Secondary | ICD-10-CM | POA: Diagnosis not present

## 2017-04-29 NOTE — Discharge Summary (Signed)
Sheilah MinsJaneth Palomino Veleta MinersCoronado is a 47 y.o. female. She is at 476w6d gestation. Patient's last menstrual period was 09/11/2016 (approximate). Estimated Date of Delivery: 06/11/17   [facility]   Chief Complaint: high risk pregnancy, need for antepartum surveillance, GDMA1  S: Resting comfortably. no CTX, no VB.no LOF,  Active fetal movement.    Maternal Medical History:   Past Medical History:  Diagnosis Date  . Gestational diabetes   . Medical history non-contributory     Past Surgical History:  Procedure Laterality Date  . NO PAST SURGERIES      Allergies  Allergen Reactions  . Pork-Derived Products Swelling  . Shrimp [Shellfish Allergy] Itching    Prior to Admission medications   Medication Sig Start Date End Date Taking? Authorizing Provider  albuterol (PROVENTIL HFA;VENTOLIN HFA) 108 (90 Base) MCG/ACT inhaler Inhale 2 puffs into the lungs every 6 (six) hours as needed for wheezing or shortness of breath. Patient not taking: Reported on 04/22/2017 04/08/17   Enid DerryWagner, Ashley, PA-C  aspirin EC 81 MG tablet Take 81 mg by mouth daily.    [provider]  calcium carbonate (TUMS EX) 750 MG chewable tablet Chew 1 tablet by mouth daily as needed for heartburn.    [provider]  Prenatal Vit-Fe Fumarate-FA (PRENATAL MULTIVITAMIN) TABS tablet Take 1 tablet by mouth daily at 12 noon.    [provider]  PRESCRIPTION MEDICATION Take 1 capsule by mouth daily.    [provider]     Social History: She  reports that  has never smoked. she has never used smokeless tobacco. She reports that she does not drink alcohol or use drugs.  Family History: family history includes Diabetes in her sister.   Review of Systems: A full review of systems was performed and negative except as noted in the HPI.     O:  BP 108/60 (BP Location: Left Arm)   Pulse 86   Temp 98.1 F (36.7 C) (Oral)   Resp 14   LMP 09/11/2016  (Approximate)  No results found for this or any previous visit (from the past 48 hour(s)).   Constitutional: NAD, AAOx3  HE/ENT: extraocular movements grossly intact, moist mucous membranes CV: RRR PULM: nl respiratory effort, CTABL     Abd: gravid, non-tender, non-distended, soft      Ext: Non-tender, Nonedmeatous   Psych: mood appropriate, speech normal Neuro:  AO x3, grossly normal Pelvic: deferred  Baseline: 145 Variability: moderate Accelerations present x >2 Decelerations absent Time 20mins    A/P: 47 y.o. 526w6d with high risk pregnancy and antepartum surveillance.   Labor: not present.   Fetal Wellbeing: Reassuring tracing.  Reactive NST   D/c home stable, precautions reviewed, follow-up as scheduled.   ----- Ranae Plumberhelsea Ward, MD Attending Obstetrician and Gynecologist [facility], Department of OB/GYN [facility]

## 2017-05-03 ENCOUNTER — Other Ambulatory Visit: Payer: Self-pay

## 2017-05-03 ENCOUNTER — Inpatient Hospital Stay
Admission: RE | Admit: 2017-05-03 | Discharge: 2017-05-03 | Disposition: A | Discharge status: Home / Self Care | Payer: Medicaid Other | Attending: Obstetrics and Gynecology | Admitting: Obstetrics and Gynecology

## 2017-05-03 DIAGNOSIS — Z3A34 34 weeks gestation of pregnancy: Secondary | ICD-10-CM | POA: Insufficient documentation

## 2017-05-03 DIAGNOSIS — O09523 Supervision of elderly multigravida, third trimester: Secondary | ICD-10-CM | POA: Insufficient documentation

## 2017-05-03 DIAGNOSIS — O24419 Gestational diabetes mellitus in pregnancy, unspecified control: Secondary | ICD-10-CM

## 2017-05-03 NOTE — OB Triage Note (Signed)
NST

## 2017-05-03 NOTE — Discharge Summary (Signed)
   Triage visit for NST   [facility] is a 47 y.o. [id number]. She is at 715w3d gestation. She presents for a scheduled NST.  Indication: high risk pregnancy, need for antepartum surveillance, GDMA1, advanced maternal age    S: Resting comfortably. no CTX, no VB. Active fetal movement. O:  BP (!) 104/59 (BP Location: Left Arm)   Pulse 86   Temp 97.9 F (36.6 C) (Oral)   Resp 16   LMP 09/11/2016 (Approximate)  No results found for this or any previous visit (from the past 48 hour(s)).   Gen: NAD, AAOx3      Abd: FNTTP      Ext: Non-tender, Nonedmeatous    NST  Baseline: 130 Variability: moderate Accelerations present x >2 Decelerations absent Time 20mins  Interpretation: reactive NST, category 1 tracing  A/P:  47 y.o. [id number] 4515w3d with high risk pregnancy, need for antepartum surveillance, GDMA1, advanced maternal age    Reactive NST, with moderate variability and accelerations, no decels  Fetal Wellbeing: Reassuring  D/c home stable, precautions reviewed, follow-up as scheduled.     ----- Christeen DouglasBethany Daejon Lich, MD MPH Attending Obstetrician and Gynecologist [facility], Department of OB/GYN [facility]

## 2017-05-05 ENCOUNTER — Telehealth: Payer: Self-pay

## 2017-05-06 ENCOUNTER — Observation Stay
Admission: RE | Admit: 2017-05-06 | Discharge: 2017-05-06 | Disposition: A | Discharge status: Home / Self Care | Payer: Medicaid Other | Attending: Certified Nurse Midwife | Admitting: Certified Nurse Midwife

## 2017-05-06 DIAGNOSIS — O09523 Supervision of elderly multigravida, third trimester: Principal | ICD-10-CM | POA: Insufficient documentation

## 2017-05-06 DIAGNOSIS — Z3A34 34 weeks gestation of pregnancy: Secondary | ICD-10-CM | POA: Diagnosis not present

## 2017-05-06 DIAGNOSIS — O09529 Supervision of elderly multigravida, unspecified trimester: Secondary | ICD-10-CM

## 2017-05-06 NOTE — Discharge Summary (Signed)
Triage visit for NST   [facility] Vele[facility] MinersCoronado is a 47 y.o. [id number]. She is at 3447w6d gestation. She presents for a scheduled NST.  Indication: AMA 46  S: Resting comfortably. No contractions/cramping, vaginal bleeding, or leakage of fluid. Active fetal movement. No concerns.   O:  BP (!) 104/50   Pulse 77   Temp 98.3 F (36.8 C) (Oral)   Resp 16   LMP 09/11/2016 (Approximate)  No results found for this or any previous visit (from the past 48 hour(s)).   Gen: NAD, AAOx3      Abd: FNTTP      Ext: Non-tender, Nonedmeatous    NST Baseline: 150 Variability: moderate Accelerations present x >2 Decelerations absent Time 70mins  Interpretation: reactive NST  A/P:  47 y.o. [id number] 5347w6d with scheduled NST for AMA.    Reactive NST, with moderate variability and accelerations, no decels  Fetal Wellbeing: Reassuring  D/c home stable, precautions reviewed, follow-up as scheduled.    Genia Bailey Simpson 05/06/2017 5:13 PM  ----- Genia Bailey Bailey Simpson, CNM, WHNP-BC [facility], Department of OB/GYN [facility]

## 2017-05-10 ENCOUNTER — Observation Stay
Admission: EM | Admit: 2017-05-10 | Discharge: 2017-05-10 | Disposition: A | Discharge status: Home / Self Care | Payer: Medicaid Other | Source: Ambulatory Visit | Attending: Obstetrics and Gynecology | Admitting: Obstetrics and Gynecology

## 2017-05-10 DIAGNOSIS — Z833 Family history of diabetes mellitus: Secondary | ICD-10-CM | POA: Insufficient documentation

## 2017-05-10 DIAGNOSIS — O471 False labor at or after 37 completed weeks of gestation: Secondary | ICD-10-CM | POA: Diagnosis present

## 2017-05-10 DIAGNOSIS — Z7984 Long term (current) use of oral hypoglycemic drugs: Secondary | ICD-10-CM | POA: Diagnosis not present

## 2017-05-10 DIAGNOSIS — Z7982 Long term (current) use of aspirin: Secondary | ICD-10-CM | POA: Insufficient documentation

## 2017-05-10 DIAGNOSIS — Z79899 Other long term (current) drug therapy: Secondary | ICD-10-CM | POA: Insufficient documentation

## 2017-05-10 DIAGNOSIS — Z91013 Allergy to seafood: Secondary | ICD-10-CM | POA: Diagnosis not present

## 2017-05-10 DIAGNOSIS — O24415 Gestational diabetes mellitus in pregnancy, controlled by oral hypoglycemic drugs: Secondary | ICD-10-CM | POA: Diagnosis not present

## 2017-05-10 DIAGNOSIS — Z91018 Allergy to other foods: Secondary | ICD-10-CM | POA: Insufficient documentation

## 2017-05-10 DIAGNOSIS — Z3A35 35 weeks gestation of pregnancy: Secondary | ICD-10-CM | POA: Insufficient documentation

## 2017-05-10 LAB — ROM PLUS (ARMC ONLY): Rom Plus: NEGATIVE

## 2017-05-10 LAB — GLUCOSE, CAPILLARY: Glucose-Capillary: 100 mg/dL — ABNORMAL HIGH (ref 65–99)

## 2017-05-10 MED ORDER — TERBUTALINE SULFATE 1 MG/ML IJ SOLN
0.2500 mg | Freq: Once | INTRAMUSCULAR | Status: AC
  Administered 2017-05-10: 0.25 mg via SUBCUTANEOUS
  Filled 2017-05-10: qty 1

## 2017-05-10 NOTE — Progress Notes (Signed)
Patient ID: Craig Staggers, female   DOB: 03/20/1971, 47 y.o.   MRN: [id number]  Abbygale Lapid Veleta Miners is a 47 y.o. female. She is at [redacted]w[redacted]d gestation. Patient's last menstrual period was 09/11/2016 (approximate). Estimated Date of Delivery: 06/11/17  Prenatal care site:  [facility]  Chief complaint:ctx , painful . Thought to be 2 cm in office . PT with some LOF for 2 days Location: Onset/timing: Duration: Quality:  Severity: Aggravating or alleviating conditions: Associated signs/symptoms: Context:  S: Resting comfortably. no CTX, no VB.+ LOF,  Active fetal movement.   Maternal Medical History:   Past Medical History:  Diagnosis Date  . Gestational diabetes   . Medical history non-contributory     Past Surgical History:  Procedure Laterality Date  . NO PAST SURGERIES      Allergies  Allergen Reactions  . Pork-Derived Products Swelling  . Shrimp [Shellfish Allergy] Itching    Prior to Admission medications   Medication Sig Start Date End Date Taking? Authorizing Provider  metFORMIN (GLUCOPHAGE) 500 MG tablet Take 500 mg by mouth 2 (two) times daily with a meal.   Yes [provider]  nitrofurantoin (MACRODANTIN) 100 MG capsule Take 100 mg by mouth daily.   Yes [provider]  albuterol (PROVENTIL HFA;VENTOLIN HFA) 108 (90 Base) MCG/ACT inhaler Inhale 2 puffs into the lungs every 6 (six) hours as needed for wheezing or shortness of breath. Patient not taking: Reported on 04/22/2017 04/08/17   Enid Derry, PA-C  aspirin EC 81 MG tablet Take 81 mg by mouth daily.    [provider]  calcium carbonate (TUMS EX) 750 MG chewable tablet Chew 1 tablet by mouth daily as needed for heartburn.    [provider]  Prenatal Vit-Fe Fumarate-FA (PRENATAL MULTIVITAMIN) TABS tablet Take 1 tablet by mouth daily at 12 noon.    [provider]     Social History: She  reports that  has never smoked. she has never used smokeless tobacco.  She reports that she does not drink alcohol or use drugs.  Family History: family history includes Diabetes in her sister.  no history of gyn cancers  Review of Systems: A full review of systems was performed and negative except as noted in the HPI.     O:  BP 106/81 (BP Location: Right Arm)   Temp 98.2 F (36.8 C) (Oral)   Resp 20   LMP 09/11/2016 (Approximate)  Results for orders placed or performed during the hospital encounter of 05/10/17 (from the past 48 hour(s))  Glucose, capillary   Collection Time: 05/10/17  1:03 PM  Result Value Ref Range   Glucose-Capillary 100 (H) 65 - 99 mg/dL  ROM Plus ([facility] only)   Collection Time: 05/10/17  1:20 PM  Result Value Ref Range   Rom Plus NEGATIVE      Constitutional: NAD, AAOx3  HE/ENT: extraocular movements grossly intact, moist mucous membranes CV: RRR PULM: nl respiratory effort, CTABL     Abd: gravid, non-tender, non-distended, soft      Ext: Non-tender, Nonedmeatous   Psych: mood appropriate, speech normal Pelvic closed  OOP . Rechecked 2 hrs later still int os closed  NST: irregular ctx  Baseline:  Variability: moderate Accelerations present x >2 Decelerations absent   Results for orders placed or performed during the hospital encounter of 05/10/17 (from the past 24 hour(s))  Glucose, capillary     Status: Abnormal   Collection Time: 05/10/17  1:03 PM  Result Value Ref Range  Glucose-Capillary 100 (H) 65 - 99 mg/dL  ROM Plus ([facility] only)     Status: None   Collection Time: 05/10/17  1:20 PM  Result Value Ref Range   Rom Plus NEGATIVE    Negative Fern      A/P: 47 y.o. 1642w3d here for antenatal surveillance forCTX Labor: not present.   Fetal Wellbeing: Reassuring Cat 1 tracing.  Reactive NST , no ROM   D/c home stable, precautions reviewed, follow-up as scheduled.    Norco 5/325 q 6 hr prn  apin   Cont fetal movt   ----- Jennell Cornerhomas Keimon Basaldua, MD Attending Obstetrician and Gynecologist [facility],  Department of OB/GYN [facility]

## 2017-05-10 NOTE — Discharge Summary (Signed)
Patient ID: Bailey Simpson, female   DOB: 08-30-1970, 47 y.o.   MRN: [id number]  Subjective   Bailey Simpson is a 47 y.o. female. She is at [redacted]w[redacted]d gestation. Patient's last menstrual period was 09/11/2016 (approximate). Estimated Date of Delivery: 06/11/17  Prenatal care site:  [facility]  Chief complaint:ctx , painful . Thought to be 2 cm in office . PT with some LOF for 2 days Location: Onset/timing: Duration: Quality:  Severity: Aggravating or alleviating conditions: Associated signs/symptoms: Context:  S: Resting comfortably. noCTX, no VB.+ LOF, Active fetal movement.   Maternal Medical History:       Past Medical History:  Diagnosis Date  . Gestational diabetes   . Medical history non-contributory          Past Surgical History:  Procedure Laterality Date  . NO PAST SURGERIES          Allergies  Allergen Reactions  . Pork-Derived Products Swelling  . Shrimp [Shellfish Allergy] Itching           Prior to Admission medications   Medication Sig Start Date End Date Taking? Authorizing Provider  metFORMIN (GLUCOPHAGE) 500 MG tablet Take 500 mg by mouth 2 (two) times daily with a meal.   Yes [provider]  nitrofurantoin (MACRODANTIN) 100 MG capsule Take 100 mg by mouth daily.   Yes [provider]  albuterol (PROVENTIL HFA;VENTOLIN HFA) 108 (90 Base) MCG/ACT inhaler Inhale 2 puffs into the lungs every 6 (six) hours as needed for wheezing or shortness of breath. Patient not taking: Reported on 04/22/2017 04/08/17   Enid Derry, PA-C  aspirin EC 81 MG tablet Take 81 mg by mouth daily.    [provider]  calcium carbonate (TUMS EX) 750 MG chewable tablet Chew 1 tablet by mouth daily as needed for heartburn.    [provider]  Prenatal Vit-Fe Fumarate-FA (PRENATAL MULTIVITAMIN) TABS tablet Take 1 tablet by mouth daily at 12 noon.    [provider]     Social History:  She  reports that  has never smoked. she has never used smokeless tobacco. She reports that she does not drink alcohol or use drugs.  Family History: family history includes Diabetes in her sister.  no history of gyn cancers  Review of Systems: A full review of systems was performed and negative except as noted in the HPI.     O:   Objective   BP 106/81 (BP Location: Right Arm)   Temp 98.2 F (36.8 C) (Oral)   Resp 20   LMP 09/11/2016 (Approximate)       Results for orders placed or performed during the hospital encounter of 05/10/17 (from the past 48 hour(s))  Glucose, capillary   Collection Time: 05/10/17  1:03 PM  Result Value Ref Range   Glucose-Capillary 100 (H) 65 - 99 mg/dL  ROM Plus ([facility] only)   Collection Time: 05/10/17  1:20 PM  Result Value Ref Range   Rom Plus NEGATIVE      Constitutional: NAD, AAOx3  HE/ENT: extraocular movements grossly intact, moist mucous membranes CV: RRR PULM: nl respiratory effort, CTABL                                         Abd: gravid, non-tender, non-distended, soft  Ext: Non-tender, Nonedmeatous                     Psych: mood appropriate, speech normal Pelvic closed  OOP . Rechecked 2 hrs later still int os closed  NST: irregular ctx  Baseline:  Variability: moderate Accelerations present x >2 Decelerations absent   LabResultsLast24Hours       Results for orders placed or performed during the hospital encounter of 05/10/17 (from the past 24 hour(s))  Glucose, capillary     Status: Abnormal   Collection Time: 05/10/17  1:03 PM  Result Value Ref Range   Glucose-Capillary 100 (H) 65 - 99 mg/dL  ROM Plus ([facility] only)     Status: None   Collection Time: 05/10/17  1:20 PM  Result Value Ref Range   Rom Plus NEGATIVE      Negative Fern      A/P: 47 y.o. 4272w3d here for antenatal surveillance forCTX Labor: not present.   Fetal Wellbeing: Reassuring  Cat 1 tracing.  Reactive NST , no ROM   D/c home stable, precautions reviewed, follow-up as scheduled.    Norco 5/325 q 6 hr prn  apin   Cont fetal movt   ----- Jennell Cornerhomas Hamilton Marinello, MD Attending Obstetrician and Gynecologist [facility], Department of OB/GYN [facility]            Electronically signed by Hidaya Daniel, Ihor Austinhomas J, MD at 05/10/2017 3:05 PM     ED to Hosp-Admission (Current) on 05/10/2017

## 2017-05-10 NOTE — OB Triage Note (Signed)
G7P4 at 35.[redacted] weeks gestation who states that she has been having intermittent contractions since 05/08/17. States that contractions have been Q5-10 minutes on 05/10/17. Reports pelvic pressure and rates pain 9/10. Denies vaginal bleeding. Reports occasional trickles of fluid; unsure if fluid is urine.

## 2017-05-17 ENCOUNTER — Telehealth: Payer: Self-pay | Admitting: Obstetrics and Gynecology

## 2017-05-17 ENCOUNTER — Inpatient Hospital Stay
Admission: RE | Admit: 2017-05-17 | Discharge: 2017-05-17 | Disposition: A | Discharge status: Home / Self Care | Payer: Medicaid Other | Attending: Obstetrics & Gynecology | Admitting: Obstetrics & Gynecology

## 2017-05-17 DIAGNOSIS — Z7984 Long term (current) use of oral hypoglycemic drugs: Secondary | ICD-10-CM | POA: Insufficient documentation

## 2017-05-17 DIAGNOSIS — Z7982 Long term (current) use of aspirin: Secondary | ICD-10-CM | POA: Insufficient documentation

## 2017-05-17 DIAGNOSIS — O09523 Supervision of elderly multigravida, third trimester: Secondary | ICD-10-CM | POA: Insufficient documentation

## 2017-05-17 DIAGNOSIS — O24419 Gestational diabetes mellitus in pregnancy, unspecified control: Secondary | ICD-10-CM | POA: Insufficient documentation

## 2017-05-17 DIAGNOSIS — Z79899 Other long term (current) drug therapy: Secondary | ICD-10-CM | POA: Insufficient documentation

## 2017-05-17 DIAGNOSIS — Z3A36 36 weeks gestation of pregnancy: Secondary | ICD-10-CM | POA: Diagnosis present

## 2017-05-17 DIAGNOSIS — Z3689 Encounter for other specified antenatal screening: Secondary | ICD-10-CM

## 2017-05-17 NOTE — Discharge Summary (Signed)
Obstetrical Discharge Summary  Patient Name: Bailey StaggersJaneth Palomino Simpson DOB: 02/25/1971 MRN: [id number]  Date of Admission: 05/17/2017 Date of Delivery: N/A Delivered by: N/A Date of Discharge: 05/17/2017  [facility]  WUJ:[id number]'WLMP:Patient's last menstrual period was 09/11/2016 (approximate). EDC Estimated Date of Delivery: 06/11/17 Gestational Age at Delivery: 2662w3d   Antepartum complications: A2GDM, AMA Admitting Diagnosis: A2GDM at 6036 3/7 weeks Secondary Diagnosis: AMA Patient Active Problem List   Diagnosis Date Noted  . Indication for care or intervention in labor or delivery 05/10/2017  . Advanced maternal age in multigravida 05/06/2017  . Labor and delivery indication for care or intervention 04/29/2017  . Advanced maternal age in multigravida, third trimester 04/26/2017  . Advanced maternal age in multigravida, first trimester     Augmentation:N/A Complications: Metformin for A2GDM Intrapartum complications/course: N/A Date of Delivery: N/A Delivered By: N/A Delivery Type: N/A Anesthesia: N/A Placenta: N/A Laceration: N/A Episiotomy: none Newborn Data: This patient has no babies on file.    She was deemed stable for discharge to home.    Discharge Physical Exam:  LMP 09/11/2016 (Approximate)   General: NAD Pulse reg. Pulm: Resp reg and non-labored ABD: Gravid DVT Evaluation: Neg. No complaints.   Hemoglobin  Date Value Ref Range Status  03/03/2017 12.1 12.0 - 16.0 g/dL Final   HGB  Date Value Ref Range Status  12/08/2012 13.4 12.0 - 16.0 g/dL Final   HCT  Date Value Ref Range Status  03/03/2017 35.7 35.0 - 47.0 % Final  12/08/2012 39.0 35.0 - 47.0 % Final     Disposition: stable, discharge to home. Baby Feeding: unknown Baby Disposition:N/A   Plan:  Bailey StaggersJaneth Palomino Simpson was discharged to home in good condition. Follow-up appointment with delivering provider as scheduled.  Discharge Medications: PNV,  Metformin  GNF:AOZHYQMVST:REACTIVE with 2 accels 15 x 15 BPM, no decels.  Signed: Sharee Pimplearon W.Irmgard Rampersaud, RN, MSN, CNM, FNP [facility]

## 2017-05-17 NOTE — OB Triage Note (Signed)
Bailey Simpson is a 47 y.o. female. She is at 4037w3d gestation. Patient's last menstrual period was 09/11/2016 (approximate). Estimated Date of Delivery: 06/11/17  [facility]  Chief complaint:NST for A2GDM and AMA  S: Resting comfortably. no CTX, no VB.no LOF,  Active fetal movement.   Maternal Medical History:   Past Medical History:  Diagnosis Date  . Gestational diabetes   . Medical history non-contributory     Past Surgical History:  Procedure Laterality Date  . NO PAST SURGERIES      Allergies  Allergen Reactions  . Pork-Derived Products Swelling  . Shrimp [Shellfish Allergy] Itching    Prior to Admission medications   Medication Sig Start Date End Date Taking? Authorizing Provider  albuterol (PROVENTIL HFA;VENTOLIN HFA) 108 (90 Base) MCG/ACT inhaler Inhale 2 puffs into the lungs every 6 (six) hours as needed for wheezing or shortness of breath. Patient not taking: Reported on 04/22/2017 04/08/17   Enid DerryWagner, Ashley, PA-C  aspirin EC 81 MG tablet Take 81 mg by mouth daily.    [provider]  calcium carbonate (TUMS EX) 750 MG chewable tablet Chew 1 tablet by mouth daily as needed for heartburn.    [provider]  metFORMIN (GLUCOPHAGE) 500 MG tablet Take 500 mg by mouth 2 (two) times daily with a meal.    [provider]  nitrofurantoin (MACRODANTIN) 100 MG capsule Take 100 mg by mouth daily.    [provider]  Prenatal Vit-Fe Fumarate-FA (PRENATAL MULTIVITAMIN) TABS tablet Take 1 tablet by mouth daily at 12 noon.    [provider]     Social History: She  reports that  has never smoked. she has never used smokeless tobacco. She reports that she does not drink alcohol or use drugs.  Family History: family history includes Diabetes in her sister. no history of gyn cancers  Review of Systems: A full review of systems was performed and negative except as noted in the HPI.     O:  LMP  09/11/2016 (Approximate)  No results found for this or any previous visit (from the past 48 hour(s)).   Constitutional: NAD, AAOx3  HE/ENT: extraocular movements grossly intact, moist mucous membranes Pulse reg. PULM: Resp non-labored.      Abd: gravid, non-tender, non-distended, soft      Ext: Non-tender, Nonedmeatous   Psych: mood appropriate, speech normal Pelvic deferred  NST: Reactive Baseline: 150 Variability: moderate Accelerations present x >2 Decelerations absent Time 20mins    A/P: 47 y.o. 1437w3d here for antenatal surveillance for A2GDM and AMA.   Labor: not present.   Fetal Wellbeing: Reassuring Cat 1 tracing.  Reactive NST   D/c home stable, precautions reviewed, follow-up as scheduled.   -----  Sharee Pimplearon W. Kenniel Bergsma, RN, MSN, CNM, FNP [facility], Department of OB/GYN [facility]    Sharee Pimplearon W Rune Mendez 5:24 PM

## 2017-05-19 LAB — OB RESULTS CONSOLE GBS: STREP GROUP B AG: NEGATIVE

## 2017-05-24 ENCOUNTER — Ambulatory Visit
Admission: RE | Admit: 2017-05-24 | Discharge: 2017-05-24 | Disposition: A | Discharge status: Home / Self Care | Payer: Medicaid Other | Source: Ambulatory Visit | Attending: Obstetrics and Gynecology | Admitting: Obstetrics and Gynecology

## 2017-05-24 DIAGNOSIS — Z3A37 37 weeks gestation of pregnancy: Secondary | ICD-10-CM | POA: Diagnosis not present

## 2017-05-24 DIAGNOSIS — O09523 Supervision of elderly multigravida, third trimester: Secondary | ICD-10-CM | POA: Diagnosis present

## 2017-05-31 ENCOUNTER — Observation Stay
Admission: RE | Admit: 2017-05-31 | Discharge: 2017-05-31 | Disposition: A | Discharge status: Home / Self Care | Payer: Medicaid Other | Attending: Obstetrics and Gynecology | Admitting: Obstetrics and Gynecology

## 2017-05-31 ENCOUNTER — Other Ambulatory Visit: Payer: Self-pay

## 2017-05-31 DIAGNOSIS — Z3689 Encounter for other specified antenatal screening: Secondary | ICD-10-CM

## 2017-05-31 DIAGNOSIS — O24419 Gestational diabetes mellitus in pregnancy, unspecified control: Principal | ICD-10-CM | POA: Insufficient documentation

## 2017-05-31 DIAGNOSIS — Z3A39 39 weeks gestation of pregnancy: Secondary | ICD-10-CM | POA: Diagnosis not present

## 2017-05-31 NOTE — Discharge Instructions (Signed)
Thursday NST 06/03/2017 Monday Induction 06/07/2017

## 2017-05-31 NOTE — OB Triage Note (Signed)
-   Weekly NST

## 2017-05-31 NOTE — Discharge Summary (Signed)
A1 GDM /ANA here for NSt   reactive NST documented   d/c home

## 2017-06-03 ENCOUNTER — Observation Stay
Admission: RE | Admit: 2017-06-03 | Discharge: 2017-06-03 | Disposition: A | Discharge status: Home / Self Care | Payer: Medicaid Other | Attending: Obstetrics and Gynecology | Admitting: Obstetrics and Gynecology

## 2017-06-03 ENCOUNTER — Other Ambulatory Visit: Payer: Self-pay | Admitting: Obstetrics and Gynecology

## 2017-06-03 DIAGNOSIS — Z3493 Encounter for supervision of normal pregnancy, unspecified, third trimester: Principal | ICD-10-CM | POA: Insufficient documentation

## 2017-06-03 DIAGNOSIS — Z3A38 38 weeks gestation of pregnancy: Secondary | ICD-10-CM | POA: Insufficient documentation

## 2017-06-03 DIAGNOSIS — Z3490 Encounter for supervision of normal pregnancy, unspecified, unspecified trimester: Secondary | ICD-10-CM

## 2017-06-03 NOTE — OB Triage Note (Signed)
Scheduled NST for GDM and NST

## 2017-06-04 NOTE — Discharge Summary (Signed)
  a1gdm  Here for NSt   reactive  D/c home

## 2017-06-07 ENCOUNTER — Inpatient Hospital Stay: Payer: Medicaid Other | Admitting: Anesthesiology

## 2017-06-07 ENCOUNTER — Inpatient Hospital Stay
Admission: EM | Admit: 2017-06-07 | Discharge: 2017-06-09 | DRG: 807 | Disposition: A | Discharge status: Home / Self Care | Payer: Medicaid Other | Source: Ambulatory Visit | Attending: Obstetrics and Gynecology | Admitting: Obstetrics and Gynecology

## 2017-06-07 ENCOUNTER — Other Ambulatory Visit: Payer: Self-pay

## 2017-06-07 DIAGNOSIS — O24419 Gestational diabetes mellitus in pregnancy, unspecified control: Secondary | ICD-10-CM | POA: Diagnosis present

## 2017-06-07 DIAGNOSIS — O9902 Anemia complicating childbirth: Secondary | ICD-10-CM | POA: Diagnosis present

## 2017-06-07 DIAGNOSIS — D509 Iron deficiency anemia, unspecified: Secondary | ICD-10-CM | POA: Diagnosis present

## 2017-06-07 DIAGNOSIS — O24425 Gestational diabetes mellitus in childbirth, controlled by oral hypoglycemic drugs: Principal | ICD-10-CM | POA: Diagnosis present

## 2017-06-07 DIAGNOSIS — Z3A39 39 weeks gestation of pregnancy: Secondary | ICD-10-CM

## 2017-06-07 LAB — CBC
HCT: 37.8 % (ref 35.0–47.0)
HEMOGLOBIN: 13 g/dL (ref 12.0–16.0)
MCH: 32.8 pg (ref 26.0–34.0)
MCHC: 34.2 g/dL (ref 32.0–36.0)
MCV: 95.9 fL (ref 80.0–100.0)
Platelets: 198 10*3/uL (ref 150–440)
RBC: 3.95 MIL/uL (ref 3.80–5.20)
RDW: 13.7 % (ref 11.5–14.5)
WBC: 8.6 10*3/uL (ref 3.6–11.0)

## 2017-06-07 LAB — TYPE AND SCREEN
ABO/RH(D): O POS
Antibody Screen: NEGATIVE

## 2017-06-07 LAB — GLUCOSE, CAPILLARY
Glucose-Capillary: 90 mg/dL (ref 65–99)
Glucose-Capillary: 93 mg/dL (ref 65–99)
Glucose-Capillary: 106 mg/dL — ABNORMAL HIGH (ref 65–99)

## 2017-06-07 MED ORDER — FENTANYL 2.5 MCG/ML W/ROPIVACAINE 0.15% IN NS 100 ML EPIDURAL (ARMC)
EPIDURAL | Status: AC
  Filled 2017-06-07: qty 100

## 2017-06-07 MED ORDER — WITCH HAZEL-GLYCERIN EX PADS: 1.0000 "application " | MEDICATED_PAD | CUTANEOUS | Status: DC | PRN

## 2017-06-07 MED ORDER — OXYTOCIN BOLUS FROM INFUSION
500.0000 mL | Freq: Once | INTRAVENOUS | Status: AC
  Administered 2017-06-07: 500 mL via INTRAVENOUS

## 2017-06-07 MED ORDER — DIBUCAINE 1 % RE OINT: 1.0000 "application " | TOPICAL_OINTMENT | RECTAL | Status: DC | PRN

## 2017-06-07 MED ORDER — LIDOCAINE HCL (PF) 1 % IJ SOLN
INTRAMUSCULAR | Status: DC | PRN
  Administered 2017-06-07: 3 mL via SUBCUTANEOUS

## 2017-06-07 MED ORDER — TETANUS-DIPHTH-ACELL PERTUSSIS 5-2.5-18.5 LF-MCG/0.5 IM SUSP
0.5000 mL | Freq: Once | INTRAMUSCULAR | Status: DC
  Filled 2017-06-07: qty 0.5

## 2017-06-07 MED ORDER — LACTATED RINGERS IV SOLN
INTRAVENOUS | Status: DC
  Administered 2017-06-07 (×2): via INTRAVENOUS

## 2017-06-07 MED ORDER — DIPHENHYDRAMINE HCL 25 MG PO CAPS: 25.0000 mg | ORAL_CAPSULE | Freq: Four times a day (QID) | ORAL | Status: DC | PRN

## 2017-06-07 MED ORDER — LIDOCAINE-EPINEPHRINE (PF) 1.5 %-1:200000 IJ SOLN
INTRAMUSCULAR | Status: DC | PRN
  Administered 2017-06-07: 3 mL via EPIDURAL

## 2017-06-07 MED ORDER — ONDANSETRON HCL 4 MG/2ML IJ SOLN: 4.0000 mg | INTRAMUSCULAR | Status: DC | PRN

## 2017-06-07 MED ORDER — SIMETHICONE 80 MG PO CHEW: 80.0000 mg | CHEWABLE_TABLET | ORAL | Status: DC | PRN

## 2017-06-07 MED ORDER — MISOPROSTOL 200 MCG PO TABS
ORAL_TABLET | ORAL | Status: AC
  Filled 2017-06-07: qty 4

## 2017-06-07 MED ORDER — FENTANYL CITRATE (PF) 100 MCG/2ML IJ SOLN
50.0000 ug | INTRAMUSCULAR | Status: DC | PRN
  Administered 2017-06-07: 50 ug via INTRAVENOUS
  Filled 2017-06-07: qty 2

## 2017-06-07 MED ORDER — LIDOCAINE HCL (PF) 1 % IJ SOLN
30.0000 mL | INTRAMUSCULAR | Status: DC | PRN
  Filled 2017-06-07: qty 30

## 2017-06-07 MED ORDER — OXYTOCIN 10 UNIT/ML IJ SOLN
INTRAMUSCULAR | Status: AC
  Filled 2017-06-07: qty 2

## 2017-06-07 MED ORDER — ONDANSETRON HCL 4 MG/2ML IJ SOLN: 4.0000 mg | Freq: Four times a day (QID) | INTRAMUSCULAR | Status: DC | PRN

## 2017-06-07 MED ORDER — FENTANYL 2.5 MCG/ML W/ROPIVACAINE 0.15% IN NS 100 ML EPIDURAL (ARMC)
EPIDURAL | Status: DC | PRN
  Administered 2017-06-07: 12 mL/h via EPIDURAL

## 2017-06-07 MED ORDER — COCONUT OIL OIL: 1.0000 "application " | TOPICAL_OIL | Status: DC | PRN

## 2017-06-07 MED ORDER — PRENATAL MULTIVITAMIN CH
1.0000 | ORAL_TABLET | Freq: Every day | ORAL | Status: DC
  Filled 2017-06-07: qty 1

## 2017-06-07 MED ORDER — OXYCODONE-ACETAMINOPHEN 5-325 MG PO TABS: 1.0000 | ORAL_TABLET | ORAL | Status: DC | PRN

## 2017-06-07 MED ORDER — SENNOSIDES-DOCUSATE SODIUM 8.6-50 MG PO TABS
2.0000 | ORAL_TABLET | ORAL | Status: DC
  Administered 2017-06-07: 2 via ORAL
  Filled 2017-06-07: qty 2

## 2017-06-07 MED ORDER — IBUPROFEN 600 MG PO TABS
600.0000 mg | ORAL_TABLET | Freq: Four times a day (QID) | ORAL | Status: DC
  Administered 2017-06-07 – 2017-06-08 (×2): 600 mg via ORAL
  Filled 2017-06-07 (×3): qty 1

## 2017-06-07 MED ORDER — BUPIVACAINE HCL (PF) 0.25 % IJ SOLN
INTRAMUSCULAR | Status: DC | PRN
  Administered 2017-06-07 (×2): 5 mL via EPIDURAL

## 2017-06-07 MED ORDER — BENZOCAINE-MENTHOL 20-0.5 % EX AERO: 1.0000 "application " | INHALATION_SPRAY | CUTANEOUS | Status: DC | PRN

## 2017-06-07 MED ORDER — ONDANSETRON HCL 4 MG PO TABS: 4.0000 mg | ORAL_TABLET | ORAL | Status: DC | PRN

## 2017-06-07 MED ORDER — LACTATED RINGERS IV SOLN
500.0000 mL | INTRAVENOUS | Status: DC | PRN
  Administered 2017-06-07 (×3): 500 mL via INTRAVENOUS

## 2017-06-07 MED ORDER — SOD CITRATE-CITRIC ACID 500-334 MG/5ML PO SOLN: 30.0000 mL | ORAL | Status: DC | PRN

## 2017-06-07 MED ORDER — ACETAMINOPHEN 325 MG PO TABS: 650.0000 mg | ORAL_TABLET | ORAL | Status: DC | PRN

## 2017-06-07 MED ORDER — ACETAMINOPHEN 325 MG PO TABS
650.0000 mg | ORAL_TABLET | ORAL | Status: DC | PRN
  Administered 2017-06-08 (×2): 650 mg via ORAL
  Filled 2017-06-07 (×2): qty 2

## 2017-06-07 MED ORDER — AMMONIA AROMATIC IN INHA
RESPIRATORY_TRACT | Status: AC
  Filled 2017-06-07: qty 10

## 2017-06-07 MED ORDER — OXYTOCIN 40 UNITS IN LACTATED RINGERS INFUSION - SIMPLE MED
1.0000 m[IU]/min | INTRAVENOUS | Status: DC
  Administered 2017-06-07: 2 m[IU]/min via INTRAVENOUS

## 2017-06-07 MED ORDER — TERBUTALINE SULFATE 1 MG/ML IJ SOLN: 0.2500 mg | Freq: Once | INTRAMUSCULAR | Status: DC | PRN

## 2017-06-07 MED ORDER — OXYCODONE-ACETAMINOPHEN 5-325 MG PO TABS: 2.0000 | ORAL_TABLET | ORAL | Status: DC | PRN

## 2017-06-07 MED ORDER — OXYTOCIN 40 UNITS IN LACTATED RINGERS INFUSION - SIMPLE MED
2.5000 [IU]/h | INTRAVENOUS | Status: DC
  Administered 2017-06-08: 2.5 [IU]/h via INTRAVENOUS
  Filled 2017-06-07: qty 1000

## 2017-06-07 MED ORDER — ZOLPIDEM TARTRATE 5 MG PO TABS: 5.0000 mg | ORAL_TABLET | Freq: Every evening | ORAL | Status: DC | PRN

## 2017-06-07 NOTE — Progress Notes (Signed)
Awaiting Pitocin from pharmacy at this time.

## 2017-06-07 NOTE — Progress Notes (Signed)
Patient arrived to LDR 1 for scheduled induction. Reports good fetal movement and some contractions. Denies leaking of fluid or vaginal bleeding. Discussed plan of care. Patient verbalized understanding.

## 2017-06-07 NOTE — Progress Notes (Signed)
Labor Progress Note  Bailey StaggersJaneth Palomino Simpson is a 47 y.o. [id number] at 6071w3d by ultrasound admitted for IOL for A2GDM and hx macrosomia.    Subjective: requesting pain med via IV, feeling some pressure with UCs.   Objective: BP 122/74   Pulse 70   Temp 98.1 F (36.7 C) (Oral)   Resp 20   Ht 5\' 7"  (1.702 m)   Wt 235 lb (106.6 kg)   LMP 09/12/2016   SpO2 96%   BMI 36.81 kg/m  Notable VS details: reviewed  Fetal Assessment: FHT:  FHR: 140 bpm, variability: moderate,  accelerations:  Present,  decelerations:  Present variable decels, non-recurrent Category/reactivity:  Category II UC:   regular, every 2-4 minutes; Pitocin Dc after decels around 1457, MVUS not adequate at this time.  SVE:   Dilation: 4.5 Effacement (%): 50 Cervical Position: Middle Station: -2 Exam by:: McVey, CNM  Amniotic color: clear with normal show.   Labs: Lab Results  Component Value Date   WBC 8.6 06/07/2017   HGB 13.0 06/07/2017   HCT 37.8 06/07/2017   MCV 95.9 06/07/2017   PLT 198 06/07/2017    Assessment / Plan: Induction of labor due to  IOL for A2GDM and hx macrosomia. Early labor, progressing on pitocin.     Labor: progressing on Pitocin, now about 2hrs since AROM. IUPC and FSE placed due to difficulty tracing FHR and ongoing intermittent variable decels. Restart Pitocin now.  Preeclampsia:  no signs or symptoms of toxicity Fetal Wellbeing:  Cat II at times, currently Cat I x 45 min.  Pain Control:  IV pain meds, Fentanyl ordered for pain.  I/D:  n/a Anticipated MOD:  NSVD  McVey, REBECCA A, CNM 06/07/2017, 3:53 PM

## 2017-06-07 NOTE — H&P (Signed)
OB History & Physical   History of Present Illness:  Chief Complaint: induction of labor for GDMA2 and hx macrosomia.   HPI:  Bailey Simpson is a 47 y.o. [id number] female at 3434w3d dated by US; US at 6419w5d by MFM.  She presents to L&D for induction of labor.   Active FM onset of ctx prior to admission, currently every 10-12 minutes Denies LOF/VB. Denies HA, VD or RUQ pain.     Pregnancy Issues: 1. Advanced maternal age, seen by MFM; informaSeq neg, AFP neg.  2. Hx GDM 2007, prediabetes A1C 5.9; GDM dx at 29wks; Metformin since 32wks 3. Hx fetal macrosomia, 9#15, no shoulder dystocia  4. Hx Pyelonephritis 02/2017, on Macrobid suppression.     Maternal Medical History:   Past Medical History:  Diagnosis Date  . Gestational diabetes   . Medical history non-contributory   maternal obesity, BMI 31.4 pre-preg  Past Surgical History:  Procedure Laterality Date  . NO PAST SURGERIES      Allergies  Allergen Reactions  . Pork-Derived Products Swelling  . Shrimp [Shellfish Allergy] Itching    Prior to Admission medications   Medication Sig Start Date End Date Taking? Authorizing Provider  calcium carbonate (TUMS EX) 750 MG chewable tablet Chew 1 tablet by mouth daily as needed for heartburn.   Yes [provider]  metFORMIN (GLUCOPHAGE) 500 MG tablet Take 500 mg by mouth 2 (two) times daily with a meal.   Yes [provider]  nitrofurantoin (MACRODANTIN) 100 MG capsule Take 100 mg by mouth daily.   Yes [provider]  Prenatal Vit-Fe Fumarate-FA (PRENATAL MULTIVITAMIN) TABS tablet Take 1 tablet by mouth daily at 12 noon.   Yes [provider]  albuterol (PROVENTIL HFA;VENTOLIN HFA) 108 (90 Base) MCG/ACT inhaler Inhale 2 puffs into the lungs every 6 (six) hours as needed for wheezing or shortness of breath. Patient not taking: Reported on 06/07/2017 04/08/17   Enid DerryWagner, Ashley, PA-C  aspirin EC 81 MG tablet Take 81 mg by mouth daily.     [provider]     [facility]ren[facility]   Social History: She  reports that  has never smoked. she has never used smokeless tobacco. She reports that she does not drink alcohol or use drugs.  OB hx:  OB History    Gravida Para Term Preterm AB Living   7 4 4   2 4    SAB TAB Ectopic Multiple Live Births   2     1 4     SVD x 4  Family History: family history includes Diabetes in her sister.   Review of Systems: A full review of systems was performed and negative except as noted in the HPI.     Physical Exam:  Vital Signs: BP 112/66 (BP Location: Right Arm) Comment: readjusted pt arm  Pulse 75   Temp 97.7 F (36.5 C) (Oral)   Resp 16   Ht 5\' 7"  (1.702 m)   Wt 235 lb (106.6 kg)   LMP 09/12/2016   SpO2 96%   BMI 36.81 kg/m  General: no acute distress.  HEENT: normocephalic, atraumatic Heart: regular rate & rhythm.  No murmurs/rubs/gallops Lungs: clear to auscultation bilaterally, normal respiratory effort Abdomen: soft, gravid, non-tender;  EFW: 8.5lbs Pelvic:   External: Normal external female genitalia  Cervix: Dilation: 2.5 / Effacement (%): 50 / Station: -1    Extremities: non-tender, symmetric, trace edema bilaterally.  DTRs: 2+ Neurologic: Alert & oriented  x 3.    Results for orders placed or performed during the hospital encounter of 06/07/17 (from the past 24 hour(s))  CBC     Status: None   Collection Time: 06/07/17  6:12 AM  Result Value Ref Range   WBC 8.6 3.6 - 11.0 K/uL   RBC 3.95 3.80 - 5.20 MIL/uL   Hemoglobin 13.0 12.0 - 16.0 g/dL   HCT 16.1 09.6 - 04.5 %   MCV 95.9 80.0 - 100.0 fL   MCH 32.8 26.0 - 34.0 pg   MCHC 34.2 32.0 - 36.0 g/dL   RDW 40.9 81.1 - 91.4 %   Platelets 198 150 - 440 K/uL  Glucose, capillary     Status: None   Collection Time: 06/07/17  6:48 AM  Result Value Ref Range   Glucose-Capillary 90 65 - 99 mg/dL  Type and screen     Status: None   Collection Time: 06/07/17  7:22 AM  Result Value  Ref Range   ABO/RH(D) O POS    Antibody Screen NEG    Sample Expiration      06/10/2017 Performed at [facility], [address], [place], [place] [address]     Pertinent Results:  Prenatal Labs: Blood type/Rh O Pos  Antibody screen neg  Rubella Immune  Varicella Immune  RPR NR  HBsAg Neg  HIV NR  GC neg  Chlamydia neg  Genetic screening negative  1 hour GTT 202  3 hour GTT [id number]  GBS neg   FHT:140bpm, mod variability, + accels, no decels.  TOCO: q10-40min SVE:  Dilation: 2.5 / Effacement (%): 50 / Station: -1    Cephalic by leopolds and SVE   [place] Mfm Ob Follow Up  Result Date: 05/24/2017 ----------------------------------------------------------------------  OBSTETRICS REPORT                      (Signed Final 05/24/2017 03:13 pm) ---------------------------------------------------------------------- PATIENT INFO:  ID #:       [id number]                          D.O.B.:  03-03-1971 (46 yrs)  Name:       Bailey Simpson                 Visit Date: 05/24/2017 02:57 pm              Simpson ---------------------------------------------------------------------- PERFORMED BY:  Performed By:     Trixie Dredge          Ref. Address:     [address]                                                             [place], [place]  [address]  Referred By:      Alberteen Spindle CNM ---------------------------------------------------------------------- SERVICE(S) PROVIDED:   [place] [facility] OB FOLLOW UP                                  [phone]  ---------------------------------------------------------------------- INDICATIONS:   [redacted] weeks gestation of pregnancy                Z3A.59   Advanced maternal age   Gestational diabetes  ---------------------------------------------------------------------- FETAL EVALUATION:  Num Of Fetuses:     1  Fetal  Heart         140  Rate(bpm):  Cardiac Activity:   Present  Presentation:       Vertex  Placenta:           Posterior, No previa doc previously  Amniotic Fluid  AFI FV:      Within normal limits  AFI Sum(cm)     %Tile       Largest Pocket(cm)  17.04           65          6.2  RUQ(cm)       RLQ(cm)       LUQ(cm)        LLQ(cm)  6.2           4.64          3.19           3.01 ---------------------------------------------------------------------- BIOMETRY:  BPD:      89.3  mm     G. Age:  36w 1d         32  %    CI:        72.68   %    70 - 86                                                          FL/HC:      23.0   %    20.8 - 22.6  HC:      333.1  mm     G. Age:  38w 0d         41  %    HC/AC:      0.95        0.92 - 1.05  AC:       350   mm     G. Age:  38w 6d         93  %    FL/BPD:     85.9   %    71 - 87  FL:       76.7  mm     G. Age:  39w 2d         88  %    FL/AC:      21.9   %    20 - 24  HUM:      67.9  mm     G. Age:  39w 4d       > 95  %  Est. FW:    3514  gm    7  lb 12 oz      80  % ---------------------------------------------------------------------- GESTATIONAL AGE:  LMP:           36w 2d        Date:  09/12/16                 EDD:   06/19/17  U/S Today:     38w 1d                                        EDD:   06/06/17  Best:          37w 3d     Det. By:  U/S C R L  (12/10/16)    EDD:   06/11/17 ---------------------------------------------------------------------- ANATOMY:  Cavum:                 Visualized             Stomach:                Seen                         previously  Ventricles:            Normal appearance      Abdominal Wall:         Visualized                                                                        previously  Cerebellum:            Visualized             Cord Vessels:           3 vessels,                         previously                                     visualized previously  Posterior Fossa:       Visualized             Kidneys:                Normal  appearance                         previously  Face:                  Orbits visualized      Bladder:                Seen                         previously  Lips:                  Visualized             Spine:  Visualized                         previously                                     previously  Heart:                 4-Chamber view         Upper Extremities:      Visualized                         appears normal                                 previously  RVOT:                  Suboptimal             Lower Extremities:      Visualized                                                                        previously  LVOT:                  Subopimal  Other:  Ao/Pa suboptimal ---------------------------------------------------------------------- IMPRESSION:  Single intrauterine pregnancy with a best estimated  gestational age of [redacted] weeks 3 days.  Dating is based on  earliest available ultrasound performed at [facility] on 12/10/2016; measurements were reported as 13  weeks 6 days.  Follow up ultrasound performed to assess growth.  Due to advanced gestational age and fetal position, views of  the heart were suboptimal but have been documented  previously.  Other fetal anatomy views appear normal or have  been previously documented.  Appropriate interval growth.  Recommend continued 2x weekly NST and weekly AFI until  delivery (scheduled for 3/14). ----------------------------------------------------------------------                  Kirby Funk, MD Electronically Signed Final Report   05/24/2017 03:13 pm ----------------------------------------------------------------------   Assessment:  Venessa Wickham is a 47 y.o. [id number] female at [redacted]w[redacted]d with IOL for A2GDM and hx macrosomia.   Plan:  1. Admit to Labor & Delivery; consents reviewed and obtained  2. Fetal Well being  - Fetal Tracing: cat I  - Group B Streptococcus ppx indicated: neg - Presentation:  cephalic confirmed by SVE and Leopolds   3. Routine OB: - Prenatal labs reviewed, as above - Rh O Pos - CBC, T&S, RPR on admit - Clear fluids, IVF  4. Induction of Labor -  Contractions: external toco in place -  Pelvis proven to 9#15 -  Plan for induction with Pitocin; will consider AROM after good UC pattern present.  -  Plan for continuous fetal monitoring  -  Maternal pain control as desired; discussed IVPM, nitrous, regional anesthesia - Anticipate vaginal delivery  5. Post Partum Planning: - Infant feeding: breast - Contraception: Paragard IUD  6. GDMA2 - plan CBG q4h until  active labor, then q1h  Giani Winther A, CNM 06/07/17 8:35 AM

## 2017-06-07 NOTE — Anesthesia Procedure Notes (Signed)
Epidural  Start time: 06/07/2017 5:22 PM End time: 06/07/2017 5:44 PM  Staffing Anesthesiologist: Naomie DeanKephart, William K, MD Resident/CRNA: Irving BurtonBachich, Isabella Ida, CRNA Performed: resident/CRNA   Preanesthetic Checklist Completed: patient identified, site marked, surgical consent, pre-op evaluation, IV checked, risks and benefits discussed and monitors and equipment checked  Epidural Patient position: sitting Prep: ChloraPrep Patient monitoring: continuous pulse ox, blood pressure and heart rate Approach: midline Location: L3-L4 Injection technique: LOR air  Needle:  Needle type: Tuohy  Needle gauge: 17 G Needle length: 9 cm Needle insertion depth: 7 cm Catheter type: closed end flexible Catheter size: 19 Gauge Catheter at skin depth: 12 cm Test dose: negative and 1.5% lidocaine with Epi 1:200 K  Assessment Events: blood not aspirated, injection not painful, no injection resistance, negative IV test and no paresthesia  Additional Notes Reason for block:procedure for pain

## 2017-06-07 NOTE — Progress Notes (Signed)
Labor Progress Note  Bailey Simpson is a 47 y.o. [id number] at 6623w3d by ultrasound admitted for IOL for A2GDM and hx macrosomia.     Subjective: Feeling painful UCs, declines IVPM. Occasional bloody show noted while up to BR  Objective: BP 122/74   Pulse 70   Temp 97.7 F (36.5 C) (Oral)   Resp 20   Ht 5\' 7"  (1.702 m)   Wt 235 lb (106.6 kg)   LMP 09/12/2016   SpO2 96%   BMI 36.81 kg/m  Notable VS details: reviewed  Fetal Assessment: FHT:  FHR: 145 bpm, variability: moderate,  accelerations:  Present,  decelerations:  Present intermittent variable decels, rare late decel noted- resolved with position change.  Category/reactivity:  Category II UC:   regular, every 2-4 minutes, pitocin at 2714mu/min  SVE:   Dilation: 3.5 Effacement (%): 50 Cervical Position: Middle Station: -1 Exam by:: [email or web address]  Membrane status: AROM performed, small amt clear fluid without odor. Scant brown/pink bloody show.  Amniotic color: clear  Labs: Lab Results  Component Value Date   WBC 8.6 06/07/2017   HGB 13.0 06/07/2017   HCT 37.8 06/07/2017   MCV 95.9 06/07/2017   PLT 198 06/07/2017    Assessment / Plan: Induction of labor due to  IOL for A2GDM and hx macrosomia. ,  progressing well on pitocin  GDM: 90-93; checking q4h until active labor.  Labor: progresssing on Pitocin, AROM now.   Preeclampsia:  no signs or symptoms of toxicity Fetal Wellbeing:  Category II Pain Control:  Labor support without medications I/D:  n/a Anticipated MOD:  NSVD  McVey, REBECCA A, CNM 06/07/2017, 1:58 PM

## 2017-06-07 NOTE — Anesthesia Preprocedure Evaluation (Signed)
Anesthesia Evaluation  Patient identified by MRN, date of birth, ID band Patient awake    Reviewed: Allergy & Precautions, H&P , NPO status , Patient's Chart, lab work & pertinent test results  History of Anesthesia Complications Negative for: history of anesthetic complications  Airway Mallampati: II       Dental no notable dental hx. (+) Teeth Intact   Pulmonary           Cardiovascular      Neuro/Psych    GI/Hepatic   Endo/Other  diabetes  Renal/GU      Musculoskeletal   Abdominal   Peds  Hematology   Anesthesia Other Findings   Reproductive/Obstetrics (+) Pregnancy                             Anesthesia Physical Anesthesia Plan  ASA: II  Anesthesia Plan: Epidural   Post-op Pain Management:    Induction:   PONV Risk Score and Plan:   Airway Management Planned:   Additional Equipment:   Intra-op Plan:   Post-operative Plan:   Informed Consent: I have reviewed the patients History and Physical, chart, labs and discussed the procedure including the risks, benefits and alternatives for the proposed anesthesia with the patient or authorized representative who has indicated his/her understanding and acceptance.     Plan Discussed with: Anesthesiologist  Anesthesia Plan Comments:         Anesthesia Quick Evaluation

## 2017-06-07 NOTE — Progress Notes (Signed)
Labor Progress Note  Bailey StaggersJaneth Palomino Simpson is a 47 y.o. [id number] at 8935w3d by ultrasound admitted for IOL for A2GDM and hx macrosomia.    Subjective: s/p epidural placement, feeling rectal pressure but overall much better.   Objective: BP (!) 106/50   Pulse 71   Temp 97.8 F (36.6 C) (Oral)   Resp 18   Ht 5\' 7"  (1.702 m)   Wt 235 lb (106.6 kg)   LMP 09/12/2016   SpO2 97%   BMI 36.81 kg/m  Notable VS details: reviewed  Fetal Assessment: FHT:  FHR: 135 bpm, variability: moderate,  accelerations:  Present,  decelerations:  Present variable decels, non-recurrent Category/reactivity:  Category II UC:   regular, every 2-4 minutes; Pitocin at 354mu/min, adequate MVUs..  SVE:   Dilation: 8 Effacement (%): 80 Cervical Position: Middle Station: 0 Exam by:: McVey, CNM  Amniotic color: clear with normal show.   Labs: Lab Results  Component Value Date   WBC 8.6 06/07/2017   HGB 13.0 06/07/2017   HCT 37.8 06/07/2017   MCV 95.9 06/07/2017   PLT 198 06/07/2017    Assessment / Plan: Induction of labor due to  IOL for A2GDM and hx macrosomia. Early labor, progressing on pitocin.     Labor: progressing well, now active labor. continue to monitor closely, prepare for shoulderdystocia due to GDM and hx macrosomia. .  Preeclampsia:  no signs or symptoms of toxicity Fetal Wellbeing:  Cat II  Pain Control:  Epidural and IV pain meds I/D:  n/a Anticipated MOD:  NSVD  McVey, REBECCA A, CNM 06/07/2017, 6:18 PM

## 2017-06-07 NOTE — Discharge Summary (Signed)
Obstetrical Discharge Summary  Patient Name: Bailey Simpson DOB: 1970/07/16 MRN: [id number]  Date of Admission: 06/07/2017 Date of Delivery: 06/07/17 Delivered by: Bailey Simpson CNM Date of Discharge: 06/07/2017  Primary OB: [facility]  JYN:[id number]'H last menstrual period was 09/12/2016. EDC Estimated Date of Delivery: 06/11/17 Gestational Age at Delivery: [redacted]w[redacted]d   Antepartum complications:  1. Advanced maternal age, seen by MFM; informaSeq neg, AFP neg.  2. Hx GDM 2007, prediabetes A1C 5.9; GDM dx at 29wks; Metformin since 32wks 3. Hx fetal macrosomia, 9#15, no shoulder dystocia  4. Hx Pyelonephritis 02/2017, on Macrobid suppression.   Admitting Diagnosis: Labor and delivery, indication for care Secondary Diagnosis: GDMA2  Patient Active Problem List   Diagnosis Date Noted  . Gestational diabetes mellitus 06/07/2017  . Pregnancy 06/03/2017  . NST (non-stress test) reactive 05/31/2017  . Indication for care or intervention in labor or delivery 05/10/2017  . Advanced maternal age in multigravida 05/06/2017  . Labor and delivery indication for care or intervention 04/29/2017  . Advanced maternal age in multigravida, third trimester 04/26/2017  . Advanced maternal age in multigravida, first trimester     Augmentation: AROM and Pitocin Complications: None Intrapartum complications/course: uncomplicated labor and delivery course.  Date of Delivery: 06/07/17 Delivered By: Bailey Simpson CNM Delivery Type: spontaneous vaginal delivery Anesthesia: epidural Placenta: spontaneous Laceration: 1st deg perineal w/ repair Episiotomy: none Newborn Data: Live born female  Birth Weight:  9lb1oz APGAR: 8, 9  Newborn Delivery   Birth date/time:  06/07/2017 19:07:00 Delivery type:  Vaginal, Spontaneous     Postpartum Procedures: repair of 1st deg laceration  Post partum course:Stable  Patient had an uncomplicated postpartum course.  By time of discharge on PPD#2, Bailey Simpson pain was controlled  on oral pain medications; Bailey Simpson had appropriate lochia and was ambulating, voiding without difficulty and tolerating regular diet.  Bailey Simpson was deemed stable for discharge to home.  Bailey Simpson glucose readings are still elevated and Bailey Simpson may require Metformin pp.    Discharge Physical Exam:  BP (!) 103/56 (BP Location: Right Arm)   Pulse 83   Temp 98 F (36.7 C) (Oral)   Resp 18   Ht 5\' 7"  (1.702 m)   Wt 235 lb (106.6 kg)   LMP 09/12/2016   SpO2 95%   Breastfeeding? Unknown   BMI 36.81 kg/m   General: NAD CV: RRR Pulm: CTABL, nl effort ABD: s/nd/nt, fundus firm and below the umbilicus Lochia: moderate Perineum: well approximated/intact DVT Evaluation: LE non-ttp, no evidence of DVT on exam.  Hemoglobin  Date Value Ref Range Status  06/07/2017 13.0 12.0 - 16.0 g/dL Final   HGB  Date Value Ref Range Status  12/08/2012 13.4 12.0 - 16.0 g/dL Final   HCT  Date Value Ref Range Status  06/07/2017 37.8 35.0 - 47.0 % Final  12/08/2012 39.0 35.0 - 47.0 % Final     Disposition: stable, discharge to home. Baby Feeding: breastmilk Baby Disposition: staying in [place]  Rh Immune globulin given: n/a Rubella vaccine given: n/a Varicella vaccine given: n/a Tdap vaccine given: AP 03/18/17 Flu vaccine given: AP 01/04/17  Contraception: Paragard IUD  Prenatal Labs:  Blood type/Rh O Pos  Antibody screen neg  Rubella Immune  Varicella Immune  RPR NR  HBsAg Neg  HIV NR  GC neg  Chlamydia neg  Genetic screening negative  1 hour GTT 202  3 hour GTT [phone]  GBS neg      Plan:  Bailey Simpson was discharged to home in good condition.  Bailey Simpson will need to continue checking glucose and report to [facility] in 1 week. Bailey Simpson has chosen to go there vs [facility].   Follow-up appointment with delivering provider in 6 weeks.  Discharge Medications: 1. Take oTC Iron and eat iron fortified breads and cereals. 2. Check sugars and report to [facility] in 1 week to see if Bailey Simpson still requires Metformin for  Diabetes. 3. FU for IUD at 6 weeks. Bailey Simpson will go to [facility] as Bailey Simpson wants to attend care there. 4. No sex x 6 weeks 5. No driving for 2 weeks. 6. REst at home. 7. Take Ibuprofen 600 mg by mouth every 6 hours for pain.    Signed:  Myrtie Cruisearon W. Jones,RN, MSN, CNM, FNP Certified Nurse Midwife [facility]

## 2017-06-08 LAB — GLUCOSE, CAPILLARY
GLUCOSE-CAPILLARY: 163 mg/dL — AB (ref 65–99)
Glucose-Capillary: 116 mg/dL — ABNORMAL HIGH (ref 65–99)
Glucose-Capillary: 98 mg/dL (ref 65–99)
Glucose-Capillary: 141 mg/dL — ABNORMAL HIGH (ref 65–99)

## 2017-06-08 LAB — CBC
HCT: 33.6 % — ABNORMAL LOW (ref 35.0–47.0)
Hemoglobin: 11.3 g/dL — ABNORMAL LOW (ref 12.0–16.0)
MCH: 32.2 pg (ref 26.0–34.0)
MCHC: 33.6 g/dL (ref 32.0–36.0)
MCV: 95.8 fL (ref 80.0–100.0)
Platelets: 139 10*3/uL — ABNORMAL LOW (ref 150–440)
RBC: 3.51 MIL/uL — ABNORMAL LOW (ref 3.80–5.20)
RDW: 13.6 % (ref 11.5–14.5)
WBC: 11 10*3/uL (ref 3.6–11.0)

## 2017-06-08 LAB — RPR: RPR: NONREACTIVE

## 2017-06-08 MED ORDER — METFORMIN HCL 500 MG PO TABS
500.0000 mg | ORAL_TABLET | Freq: Two times a day (BID) | ORAL | Status: DC
  Administered 2017-06-08 – 2017-06-09 (×2): 500 mg via ORAL
  Filled 2017-06-08 (×3): qty 1

## 2017-06-08 NOTE — Anesthesia Postprocedure Evaluation (Signed)
Anesthesia Post Note  Patient: [name]  Procedure(s) Performed: AN AD HOC LABOR EPIDURAL  Patient location during evaluation: Mother Baby Anesthesia Type: Epidural Level of consciousness: awake and alert and oriented Pain management: pain level controlled Vital Signs Assessment: post-procedure vital signs reviewed and stable Respiratory status: spontaneous breathing Cardiovascular status: stable Postop Assessment: adequate PO intake, no apparent nausea or vomiting and patient able to bend at knees Anesthetic complications: no Comments: Pt complaining of non-positional headache. MDA aware     Last Vitals:  Vitals:   06/08/17 0440 06/08/17 0836  BP: (!) 100/54 (!) 97/53  Pulse: 78 60  Resp: 18 16  Temp: 36.9 C 36.9 C  SpO2: 98% 98%    Last Pain:  Vitals:   06/08/17 0836  TempSrc: Oral  PainSc:                  Zachary GeorgeWeatherly,  Saory Carriero F

## 2017-06-08 NOTE — Progress Notes (Signed)
Post Partum Day 1  Subjective: Doing well, no concerns. Ambulating without difficulty, pain managed with PO meds, tolerating regular diet, and voiding without difficulty.   No fever/chills, chest pain, shortness of breath, nausea/vomiting, or leg pain. No nipple or breast pain.   Objective: BP (!) 100/54 (BP Location: Right Arm)   Pulse 78   Temp 98.4 F (36.9 C) (Axillary)   Resp 18   Ht 5\' 7"  (1.702 m)   Wt 106.6 kg (235 lb)   LMP 09/12/2016   SpO2 98%   Breastfeeding? Unknown   BMI 36.81 kg/m    Physical Exam:  General: alert, cooperative, appears stated age and tired Breasts: soft/nontender CV: RRR Pulm: nl effort, CTABL Abdomen: soft, non-tender, active bowel sounds Uterine Fundus: firm Lochia: appropriate DVT Evaluation: No evidence of DVT seen on physical exam. No cords or calf tenderness. No significant calf/ankle edema.  Recent Labs    06/07/17 0612 06/08/17 0717  HGB 13.0 11.3*  HCT 37.8 33.6*  WBC 8.6 11.0  PLT 198 139*    Assessment/Plan: 46 y.o. [id number] postpartum day # 1  -Continue routine PP care. -Lactation consult as needed.  -Plan for discharge to home tomorrow.   Disposition: Continue inpatient postpartum care.    LOS: 1 day   Genia DelMargaret Marialy Urbanczyk, CNM 06/08/2017, 8:27 AM   ----- Genia DelMargaret Masiel Gentzler Certified Nurse Midwife [facility]

## 2017-06-09 LAB — GLUCOSE, CAPILLARY
GLUCOSE-CAPILLARY: 80 mg/dL (ref 65–99)
Glucose-Capillary: 121 mg/dL — ABNORMAL HIGH (ref 65–99)
Glucose-Capillary: 113 mg/dL — ABNORMAL HIGH (ref 65–99)

## 2017-06-09 NOTE — Progress Notes (Signed)
Post Partum Day 2 Subjective: My baby may need to stay for light therapy. Via Interpreter for rounds  Objective: Blood pressure 118/61, pulse 66, temperature 98.3 F (36.8 C), temperature source Oral, resp. rate 20, height 5\' 7"  (1.702 m), weight 235 lb (106.6 kg), last menstrual period 09/12/2016, SpO2 99 %, unknown if currently breastfeeding.  Physical Exam:  General: A,A& O x3 HEART: S1S2, RRR, No M/R/G Lungs:CTA bilat, no W/R/R. Resp reg and non-labored.  Lochia: mod, no clots Uterine Fundus: U-2, FF DVT Evaluation:Neg Homans Voiding WNL Taking po well VOZ:DGUYQIHLac:healing well, no hematoma or edema noted Recent Labs    06/07/17 0612 06/08/17 0717  HGB 13.0 11.3*  HCT 37.8 33.6*  WBC 8.6 11.0  PLT 198 139*    Assessment/Plan: 1. A2GDM 2. AMA 3. Fe def anemia P: DC home 2. Baby may have to stay here 3. Fe OTC 4. Check glucose fasting and 2 h pp x 1 week [facility] pt wants to return there. 5. Plans poss IUD at 6 weeks   LOS: 2 days   Sharee Pimplearon W Jerika Wales 06/09/2017, 9:08 AM

## 2017-06-09 NOTE — Discharge Instructions (Signed)
Parto vaginal, cuidados posteriores Vaginal Delivery, Care After Siga estas instrucciones durante las prximas semanas. Estas indicaciones le proporcionan informacin acerca de cmo deber cuidarse despus del parto vaginal. Su mdico tambin podr darle indicaciones ms especficas. El tratamiento ha sido planificado segn las prcticas mdicas actuales, pero en algunos casos pueden ocurrir problemas. Llame al mdico si tiene problemas o preguntas. Qu puedo esperar despus del procedimiento? Despus de un parto vaginal, es frecuente tener lo siguiente:  Hemorragia leve de la vagina.  Dolor en el abdomen, la vagina y la zona de la piel entre la abertura vaginal y el ano (perineo).  Calambres plvicos.  Fatiga.  Siga estas indicaciones en su casa: Medicamentos  Tome los medicamentos de venta libre y los recetados solamente como se lo haya indicado el mdico.  Si le recetaron un antibitico, tmelo como se lo haya indicado el mdico. No interrumpa la administracin del antibitico hasta que lo haya terminado. Conducir   No conduzca ni opere maquinaria pesada mientras toma analgsicos recetados.  No conduzca durante 24horas si le administraron un sedante. Estilo de vida  No beba alcohol. Esto es de suma importancia si est amamantando o toma analgsicos.  No consuma productos que contengan tabaco, incluidos cigarrillos, tabaco de Theatre manager o cigarrillos electrnicos. Si necesita ayuda para dejar de fumar, consulte al mdico. Qu debe comer y beber  Beba al menos 8vasos de ochoonzas (240cc) de agua todos los [address]  [address] a menos que el mdico le indique lo contrario. Si elige amamantar al beb, quiz deba beber an ms cantidad de agua.  Coma alimentos ricos en [employer]. Estos alimentos pueden ayudarla a prevenir o Educational psychologist. Los alimentos ricos en fibras incluyen, entre otros: ? Panes y cereales integrales. ? Arroz integral. ? Armed forces operational officer. ? Nils Pyle y verduras  frescas. Actividad  Retome sus actividades normales como se lo haya indicado el mdico. Pregntele al mdico qu actividades son seguras para usted.  Descanse todo lo que pueda. Trate de descansar o tomar una siesta mientras el beb est durmiendo.  No levante objetos que pesen ms que su beb o 10libras (4,5kg) hasta que el mdico le diga que es seguro.  Hable con el mdico sobre cundo puede retomar la actividad sexual. Esto puede depender de lo siguiente: ? Riesgo de sufrir una infeccin. ? Velocidad de cicatrizacin. ? Comodidad y deseo de Clinical research associate sexual. Cuidados vaginales  Si le realizaron una episiotoma o tuvo un desgarro vaginal, contrlese la zona [employer] para detectar signos de infeccin. Est atenta a los siguientes signos: ? Aumento del enrojecimiento, la hinchazn o Chief Technology Officer. ? Mayor presencia de lquido o [place]. ? Calor. ? Pus o mal olor.  No use tampones ni se haga duchas vaginales hasta que el mdico la autorice.  Controle la sangre que elimina por la vagina para detectar cogulos de [place]. Estos pueden tener el aspecto de grumos de color rojo oscuro, o secrecin marrn o negra. Instrucciones generales  Mantenga el perineo limpio y seco, como se lo haya indicado el mdico.  Use ropa cmoda y suelta.  Cuando vaya al bao, siempre higiencese de adelante [employer].  Pregntele al mdico si puede ducharse o tomar baos de inmersin. Si se le realiz una episiotoma o tuvo un desgarro perineal durante el trabajo del parto o el parto, es posible que el mdico le indique que no tome baos de inmersin durante un determinado tiempo.  Use un sostn que sujete y ajuste bien sus pechos.  Si es posible, pdale a alguien que la ayude con las tareas del hogar y a Scientist, product/process development del beb durante al menos [employer] despus de que le den el alta del hospital.  Chauncy Passy a todas las visitas de seguimiento para usted y el beb, como se lo haya indicado el  mdico. Esto es importante. Comunquese con un mdico si:  Tiene los siguientes sntomas: ? Secrecin vaginal que tiene mal olor. ? Dificultad para orinar. ? Dolor al [employer]. ? Aumento o disminucin repentinos de la frecuencia de las deposiciones. ? Ms enrojecimiento, hinchazn o dolor alrededor de la episiotoma o del desgarro vaginal. ? Ms secrecin de lquido o sangre de la episiotoma o del desgarro vaginal. ? Pus o mal olor proveniente de la episiotoma o del desgarro vaginal. ? Grant Ruts. ? Erupcin cutnea. ? Poco inters o falta de inters en actividades que solan gustarle. ? Dudas sobre su cuidado y el del beb.  Siente la episiotoma o el desgarro vaginal caliente al tacto.  La episiotoma o el desgarro vaginal se abren o no IT sales professional.  Siente dolor en las [place], o estn duras o enrojecidas.  Siente tristeza o preocupacin de forma inusual.  Siente nuseas o vomita.  Elimina cogulos de sangre grandes por la vagina. Si expulsa un cogulo de sangre por la vagina, gurdelo para mostrrselo a su mdico. No tire la cadena sin que el mdico examine el cogulo de sangre antes.  Orina ms de lo habitual.  Se siente mareada o se desmaya.  No ha amamantado para nada y no ha tenido un perodo menstrual durante 12 semanas despus del [place].  Dej de amamantar al beb y no ha tenido su perodo menstrual durante 12 semanas despus de dejar de Museum/gallery exhibitions officer. Solicite ayuda de inmediato si:  Tiene los siguientes sntomas: ? Dolor que no desaparece o no mejora con medicamentos. ? Journalist, newspaper. ? Dificultad para respirar. ? Visin borrosa o Nurse, adult. ? Pensamientos de autolesionarse o lesionar al beb.  Comienza a Psychiatrist abdomen o en una de las piernas.  Presenta un dolor de cabeza intenso.  Se desmaya.  Tiene una hemorragia de la vagina tan intensa que empapa dos toallitas sanitarias en [place]. Esta informacin no tiene Microbiologist el consejo del mdico. Asegrese de hacerle al mdico cualquier pregunta que tenga. Document Released: 03/09/2005 Document Revised: 07/01/2016 Document Reviewed: 03/24/2015 Elsevier Interactive Patient Education  2018 [employer]. Anemia Anemia La anemia es una afeccin en la cual no hay suficientes glbulos rojos o hemoglobina. La hemoglobina es la sustancia de los glbulos rojos que lleva el oxgeno. Cuando no hay suficientes glbulos rojos o hemoglobina (est anmico), su cuerpo no puede recibir el oxgeno suficiente, y es posible que sus rganos no funcionen correctamente. Como [place], es posible que se sienta muy cansado o sufra otros problemas. Cules son las causas? Las causas ms frecuentes de anemia son:  Sharlyne Pacas. La anemia puede ser causada por un sangrado excesivo dentro o fuera del cuerpo, incluido el sangrado del intestino o del perodo en las mujeres.  Dficit nutricional.  Enfermedad heptica, tiroidea o renal (crnicas).  Trastornos de la mdula sea.  Cncer y tratamientos para Management consultant.  VIH (virus de inmunodeficiencia [place]) y SIDA (sndrome de inmunodeficiencia adquirida).  Tratamientos para el VIH y [place].  Problemas en el bazo.  Enfermedades de [employer].  Infecciones, medicamentos y enfermedades autoinmunes que [employer] glbulos rojos.  Cules son los  signos o los sntomas? Los sntomas de esta afeccin incluyen los siguientes:  Debilidad leve.  Mareos.  Dolor de [place].  Sensacin de latidos cardacos irregulares o ms rpidos que lo normal (palpitaciones).  Falta de aire, especialmente con el ejercicio.  Palidez.  Sensibilidad al fro.  Dispepsia.  Nuseas.  Dificultad para dormir.  Dificultad para concentrarse.  Los sntomas pueden ocurrir repentinamente o Audiological scientist. Si la anemia es leve, es posible que no tenga sntomas. Cmo se diagnostica? Esta afeccin se diagnostica en funcin de  lo siguiente:  Anlisis de sangre.  Sus antecedentes mdicos.  Un examen fsico.  Biopsia de mdula sea.  Adems, el mdico puede controlar si hay sangre en sus heces (materia fecal) y Education officer, environmental anlisis adicionales para Engineer, manufacturing la causa del sangrado. Tambin pueden hacerle otros estudios, por ejemplo:  Pruebas de diagnstico por imgenes, como una resonancia magntica (RM) o una exploracin por tomografa computarizada (TC).  Endoscopia.  Colonoscopia.  Cmo se trata? El tratamiento de esta afeccin depende de la causa. Si contina perdiendo [employer], es posible que necesite recibir tratamiento en un hospital. El tratamiento puede incluir lo siguiente:  Tomar suplementos de hierro, vitamina B12 o cido flico.  Tomar un medicamento para las hormonas (eritropoyetina) que puede ayudar a Radio producer de glbulos rojos.  Recibir una transfusin de [place]. Esta ser necesaria si pierde [employer].  Realizar cambios en la dieta.  Someterse a [place] para Public house manager.  Siga estas indicaciones en su casa:  Tome los medicamentos de venta libre y los recetados solamente como se lo haya indicado el mdico.  Tome los suplementos solamente como se lo haya indicado el mdico.  Siga las instrucciones de la dieta que le hayan dado.  Concurra a todas las visitas de [address] se lo haya indicado el mdico. Esto es importante. Comunquese con un mdico si:  Tiene nuevos sangrados en cualquier parte del cuerpo. Solicite ayuda de inmediato si:  Se siente muy dbil.  Le falta el aire.  Siente dolor en la espalda, el abdomen o el pecho.  Se siente mareado o sufre un desmayo.  Tiene dificultad para concentrarse.  Las heces son alquitranadas, sanguinolentas o negras.  Vomita repetidamente o vomita sangre. Resumen  La anemia es una afeccin en la que no hay suficientes glbulos rojos o la cantidad suficiente de la sustancia de los glbulos rojos  que transporta el oxgeno (hemoglobina).  Los sntomas pueden ocurrir repentinamente o Audiological scientist.  Si la anemia es leve, es posible que no tenga sntomas.  Esta afeccin se diagnostica mediante anlisis de sangre y un examen fsico, y en funcin de sus antecedentes mdicos. Pueden ser necesarios otros estudios.  El tratamiento de esta afeccin depende de la causa de la anemia. Esta informacin no tiene Theme park manager el consejo del mdico. Asegrese de hacerle al mdico cualquier pregunta que tenga. Document Released: 03/09/2005 Document Revised: 06/29/2016 Document Reviewed: 06/29/2016 Elsevier Interactive Patient Education  2018 [employer]. Control del nivel sanguneo de glucosa en los adultos (Blood Glucose Monitoring, Adult) El control del nivel de azcar (glucosa) en la sangre lo ayuda a tener la diabetes bajo control. Tambin ayuda a que usted y el mdico controlen si el tratamiento de la diabetes es Engineer, manufacturing. El control del nivel sanguneo de glucosa implica realizar controles regulares como lo indique el mdico y Midwife registro de los resultados (registro diario). POR QU DEBO CONTROLAR EL NIVEL SANGUNEO DE GLUCOSA? Si controla su nivel sanguneo de  glucosa con regularidad, podr:  Comprender de [employer] Inc alimentos, la actividad fsica, las enfermedades y los medicamentos inciden en los niveles sanguneos de [place].  Conocer el nivel sanguneo de glucosa en cualquier momento dado. Saber rpidamente si el nivel es bajo (hipoglucemia) o alto (hiperglucemia).  Puede ser de ayuda para que usted y el mdico sepan cmo ajustar los medicamentos. CUNDO DEBO CONTROLAR EL NIVEL SANGUNEO DE GLUCOSA? Siga las indicaciones del mdico acerca de la frecuencia con la que debe controlar el nivel sanguneo de glucosa. La frecuencia puede depender de:  El tipo de diabetes que tenga.  Si su diabetes est bajo control.  Los medicamentos que toma. Si usted tiene  diabetes tipo 1:  Controle su nivel sanguneo de glucosa al [employer] al da.  Tambin controle su nivel sanguneo de glucosa: ? Antes de cada inyeccin de insulina. ? Antes y despus de hacer ejercicio. ? Entre las comidas. ? Dos horas despus de una comida. ? Ocasionalmente, entre las 2:00a.m. y las 3:00a.m., como se lo hayan indicado. ? Antes de Education officer, environmental tareas peligrosas, Sport and exercise psychologist o usar maquinaria pesada. ? A la hora de acostarse.  Es posible que Chemical engineer con ms frecuencia los niveles sanguneos de [place], [place] 6 a 10veces por da: ? Si [place] una bomba de [place]. ? Si necesita varias inyecciones diarias. ? Si su diabetes no est bien controlada. ? Si est enfermo. ? Si tiene antecedentes de hipoglucemia grave. ? Si tiene antecedentes de no darse cuenta cundo est bajando su nivel sanguneo de glucosa (hipoglucemia asintomtica). Si usted tiene diabetes tipo 2:  Si recibe insulina u otro medicamento para la diabetes, controle el nivel sanguneo de glucosa al [employer] veces al da.  Mientras reciba tratamiento intensivo con insulina, debe medirse el nivel sanguneo de glucosa al menos 4veces al [employer]. Ocasionalmente, es posible que deba controlarse entre las 2:00a.m. y las 3:00a.m., segn se lo indiquen.  Tambin controle su nivel sanguneo de glucosa: ? Antes y despus de hacer ejercicio. ? Antes de Education officer, environmental tareas peligrosas, Sport and exercise psychologist o usar maquinaria pesada.  Es posible que Chemical engineer con ms frecuencia los niveles sanguneos de glucosa si: ? Es necesario ajustar la dosis de sus medicamentos. ? Su diabetes no est bien controlada. ? Est enfermo. QU ES UN REGISTRO DIARIO DEL NIVEL [place] DE GLUCOSA?  Un registro diario es un registro de los valores de glucosa en la [place]. Puede ayudarles a usted y a su mdico a: ? Conservator, museum/gallery en su nivel sanguneo de glucosa durante el transcurso del [place]. ? Ajustar su plan de control de la  diabetes como sea necesario.  Cada vez que controle su nivel sanguneo de glucosa, anote el resultado y aquellos factores que pueden estar afectando su nivel sanguneo de glucosa, como la dieta y la actividad fsica Dietitian.  La [employer] de los medidores de glucosa guardan un registro de las lecturas realizadas con el medidor. Algunos permiten descargar sus registros en una computadora.  CMO ME CONTROLO EL NIVEL SANGUNEO DE GLUCOSA? Siga los siguientes pasos para obtener lecturas precisas de su glucemia: Materiales necesarios  Medidor de Horticulturist, commercial.  Tiras reactivas para el medidor. Cada medidor tiene sus propias tiras reactivas. Debe usar las tiras reactivas que trae su medidor.  Una aguja para pincharse el dedo (lanceta). No utilice la misma lanceta en ms de una ocasin.  Un dispositivo que sujeta la lanceta (dispositivo de puncin).  Un diario o libro  de anotaciones para [employer]. Procedimiento  [employer] con agua y [place].  Pnchese el costado del dedo (no la punta) con Optometrist. Use un dedo diferente cada vez.  Frote suavemente el dedo [employer] aparezca una pequea gota de [place].  Siga las instrucciones que vienen con el medidor para Public affairs consultant tira Firefighter, Contractor la sangre sobre la tira y usar el medidor de Horticulturist, commercial.  Registre el resultado y las observaciones que desee. Zonas del cuerpo alternativas para realizar las pruebas  Algunos medidores le permiten tomar sangre para la prueba de otras zonas del cuerpo que no son el dedo (zonas alternativas).  Si cree que tiene hipoglucemia o si tiene hipoglucemia asintomtica, no utilice las zonas alternativas del cuerpo. En su lugar, use los dedos.  Es posible que las zonas alternativas no sean tan precisas como los dedos porque el flujo de sangre es ms lento en esas zonas. Esto significa que el resultado que obtiene de estas zonas puede estar retrasado y ser un poco  diferente del resultado que obtendra del dedo.  Los sitios alternativos ms comunes son los siguientes: ? Los antebrazos. ? Los muslos. ? La palma de [employer]. Consejos adicionales  Siempre tenga los insumos a mano.  Todos los medidores de glucosa incluyen un nmero de telfono "directo", disponible las 24 horas, al que podr llamar si tiene preguntas o [place]. Tambin puede consultar a su mdico.  Despus de usar algunas cajas de tiras reactivas, ajuste (calibre) el medidor de glucemia segn las instrucciones del medidor. Esta informacin no tiene Theme park manager el consejo del mdico. Asegrese de hacerle al mdico cualquier pregunta que tenga. Document Released: 03/09/2005 Document Revised: 07/01/2015 Document Reviewed: 08/19/2015 Elsevier Interactive Patient Education  2017 Elsevier Inc. Diagnstico de diabetes mellitus gestacional (Gestational Diabetes Mellitus, Diagnosis) La diabetes gestacional (diabetes mellitus gestacional) es una forma de diabetes a corto plazo (temporal) que puede Museum/gallery curator. Este cuadro desaparece despus del parto. Puede deberse a uno de [employer] o a ambos:  El cuerpo no produce la cantidad suficiente de una hormona llamada insulina.  El cuerpo no responde de forma normal a la insulina que produce. La insulina permite que los ciertos azcares (glucosa) ingresen a las clulas del cuerpo. Esto le proporciona la energa. Cuando se tiene diabetes, la glucosa no puede ingresar a las clulas. Esto produce un aumento del nivel de glucosa en la sangre (hiperglucemia). Si la diabetes se trata, es posible que ni usted ni el beb se vean afectados. El mdico fijar los objetivos del tratamiento para usted. Generalmente, los resultados de la glucemia deben ser los siguientes:  Despus de no comer durante mucho tiempo (ayunar): 95mg /dl (1,6XWRU/E).  Despus de las comidas (posprandial): ? Una hora despus de una comida: igual o  menor que 140mg /dl (4,5WUJW/J). ? Dos horas despus de una comida: igual o menor que 120mg /dl (1,9JYNW/G).  Nivel de A1c (hemoglobinaA1c): del 6% al 6,5%. CUIDADOS EN EL HOGAR Preguntas para hacerle al mdico Puede hacer las siguientes preguntas:  Debo reunirme con IT trainer para el cuidado de la diabetes?  Dnde puedo encontrar un grupo de apoyo para personas diabticas?  Qu equipos necesitar para cuidarme en casa?  Qu medicamentos para la diabetes necesito? Cundo debo tomarlos?  Con qu frecuencia debo controlarme el nivel de glucosa en la sangre?  A qu nmero puedo llamar si tengo preguntas?  Cundo es la prxima cita con el mdico? Instrucciones generales  [employer]  los medicamentos de venta libre y los recetados solamente como se lo haya indicado el mdico.  Mantenga un peso saludable durante el [place].  Concurra a todas las visitas de control como se lo haya indicado el mdico. Esto es importante. SOLICITE AYUDA SI:  Su nivel de glucosa en la sangre es igual o superior a 240mg /dl (16,1WRUE/AV13,3mmol/dl).  Su nivel de glucosa en la sangre es igual o superior a 200mg /dl (40,9WJXB/J11,1mmol/l), y tiene cetonas en la orina.  Ha estado enferma o ha tenido fiebre durante 2o ms [address]  [address] y no [place].  Si tiene alguno de estos problemas durante ms de 6horas: ? No puede comer ni beber. ? Siente malestar estomacal (nuseas). ? Vomita. ? La materia fecal es lquida (diarrea). SOLICITE AYUDA DE INMEDIATO SI:  El nivel de glucosa en la sangre est por debajo de 54mg /dl (753mmol/l).  Est confundida.  Tiene dificultad para hacer lo siguiente: ? Pensar con claridad. ? Respirar.  El beb se mueve menos de lo normal.  Tiene los siguientes sntomas: ? Niveles moderados o altos de cetonas en la orina. ? Hemorragia vaginal. ? Secrecin de un lquido fuera de lo comn de la vagina. ? Contracciones prematuras. Estas pueden causar una sensacin de opresin en el  vientre. Esta informacin no tiene Theme park managercomo fin reemplazar el consejo del mdico. Asegrese de hacerle al mdico cualquier pregunta que tenga. Document Released: 07/01/2015 Document Revised: 07/01/2015 Document Reviewed: 04/12/2015 Elsevier Interactive Patient Education  [employer]2018 Elsevier Inc.

## 2017-06-09 NOTE — Progress Notes (Signed)
Reviewed all patients discharge instructions and handouts regarding postpartum bleeding, no intercourse for 6 weeks, signs and symptoms of mastitis and postpartum bleu's. Reviewed discharge instructions for newborn regarding proper cord care, how and when to bathe the newborn, nail care, proper way to take the baby's temperature, along with safe sleep. All questions have been answered at this time. Patient discharged via wheelchair with NT.  

## 2017-10-29 ENCOUNTER — Other Ambulatory Visit: Payer: Self-pay

## 2017-10-29 ENCOUNTER — Emergency Department: Payer: Medicaid Other

## 2017-10-29 ENCOUNTER — Emergency Department
Admission: EM | Admit: 2017-10-29 | Discharge: 2017-10-29 | Disposition: A | Discharge status: Home / Self Care | Payer: Medicaid Other | Attending: Emergency Medicine | Admitting: Emergency Medicine

## 2017-10-29 DIAGNOSIS — N938 Other specified abnormal uterine and vaginal bleeding: Secondary | ICD-10-CM | POA: Diagnosis present

## 2017-10-29 DIAGNOSIS — Z79899 Other long term (current) drug therapy: Secondary | ICD-10-CM | POA: Diagnosis not present

## 2017-10-29 LAB — COMPREHENSIVE METABOLIC PANEL
ALT: 40 U/L (ref 0–44)
AST: 32 U/L (ref 15–41)
Albumin: 4 g/dL (ref 3.5–5.0)
Alkaline Phosphatase: 73 U/L (ref 38–126)
Anion gap: 6 (ref 5–15)
BUN: 11 mg/dL (ref 6–20)
CO2: 28 mmol/L (ref 22–32)
Calcium: 9.3 mg/dL (ref 8.9–10.3)
Chloride: 103 mmol/L (ref 98–111)
Creatinine, Ser: 0.62 mg/dL (ref 0.44–1.00)
Glucose, Bld: 176 mg/dL — ABNORMAL HIGH (ref 70–99)
POTASSIUM: 4.4 mmol/L (ref 3.5–5.1)
Sodium: 137 mmol/L (ref 135–145)
Total Bilirubin: 0.8 mg/dL (ref 0.3–1.2)
Total Protein: 7.6 g/dL (ref 6.5–8.1)

## 2017-10-29 LAB — CBC
HCT: 38.2 % (ref 35.0–47.0)
Hemoglobin: 13.1 g/dL (ref 12.0–16.0)
MCH: 32.2 pg (ref 26.0–34.0)
MCHC: 34.4 g/dL (ref 32.0–36.0)
MCV: 93.5 fL (ref 80.0–100.0)
PLATELETS: 206 10*3/uL (ref 150–440)
RBC: 4.08 MIL/uL (ref 3.80–5.20)
RDW: 13.3 % (ref 11.5–14.5)
WBC: 11.4 10*3/uL — AB (ref 3.6–11.0)

## 2017-10-29 LAB — TYPE AND SCREEN
ABO/RH(D): O POS
ANTIBODY SCREEN: NEGATIVE

## 2017-10-29 LAB — POCT PREGNANCY, URINE: PREG TEST UR: NEGATIVE

## 2017-10-29 NOTE — ED Notes (Signed)
Pelvic completed by MD with RN at bedside. Pt tolerated well. Pt aware of POC - US - and in agreement.

## 2017-10-29 NOTE — Discharge Instructions (Signed)
Please follow-up with your primary care doctor within the next 1 week for recheck/reevaluation.  Return to the emergency department for any significant increase in bleeding or if you become lightheaded/dizzy, or any other symptom personally concerning to yourself.

## 2017-10-29 NOTE — ED Triage Notes (Signed)
Pt c/o having lower abd cramping/stabbing pain with heavy bleeding passing clots since Wednesday. This is the first period since her child was born 4 months ago. Pt is unable to sit down in triage due to pain in her abd.

## 2017-10-29 NOTE — ED Notes (Signed)
Pt to and from US. ABCs intact. NAD. 

## 2017-10-29 NOTE — ED Provider Notes (Signed)
[facility] Emergency Department Provider Note  Time seen: 2:59 PM  I have reviewed the triage vital signs and the nursing notes.  Spanish interpreter used for this evaluation  HISTORY  Chief Complaint Vaginal bleeding   HPI Bailey Simpson is a 47 y.o. female G5, P5 status post delivery 5 months ago who presents to the emergency department for vaginal bleeding.  According to the patient since having a baby 5 months ago she is not had any vaginal bleeding until 3 days ago.  Patient states she had an IUD placed approximately 2 months after her delivery, then approximately 1 month later had the IUD removed because she did not like it.  States this is her first menstrual cycle since having her baby 5 months ago.  States it started fairly heavy for the patient, with large clots.  Thought it would go away but it did not and that worsened somewhat today so the patient came to the emergency department for evaluation.   Past Medical History:  Diagnosis Date  . Gestational diabetes   . Medical history non-contributory     Patient Active Problem List   Diagnosis Date Noted  . Gestational diabetes mellitus 06/07/2017  . Pregnancy 06/03/2017  . NST (non-stress test) reactive 05/31/2017  . Indication for care or intervention in labor or delivery 05/10/2017  . Advanced maternal age in multigravida 05/06/2017  . Labor and delivery indication for care or intervention 04/29/2017  . Advanced maternal age in multigravida, third trimester 04/26/2017  . Advanced maternal age in multigravida, first trimester     Past Surgical History:  Procedure Laterality Date  . NO PAST SURGERIES      Prior to Admission medications   Medication Sig Start Date End Date Taking? Authorizing Provider  Prenatal Vit-Fe Fumarate-FA (PRENATAL MULTIVITAMIN) TABS tablet Take 1 tablet by mouth daily at 12 noon.   Yes [provider]  albuterol (PROVENTIL HFA;VENTOLIN HFA) 108 (90  Base) MCG/ACT inhaler Inhale 2 puffs into the lungs every 6 (six) hours as needed for wheezing or shortness of breath. Patient not taking: Reported on 06/07/2017 04/08/17   Enid Derry, PA-C    Allergies  Allergen Reactions  . Pork-Derived Products Swelling  . Shrimp [Shellfish Allergy] Itching    Family History  Problem Relation Age of Onset  . Diabetes Sister     Social History Social History   Tobacco Use  . Smoking status: Never Smoker  . Smokeless tobacco: Never Used  Substance Use Topics  . Alcohol use: No  . Drug use: No    Review of Systems Constitutional: Negative for fever. Cardiovascular: Negative for chest pain. Respiratory: Negative for shortness of breath. Gastrointestinal: States mild lower abdominal cramping are negative for vomiting or diarrhea Genitourinary: Negative for dysuria.  Positive for vaginal bleeding. Musculoskeletal: Negative for musculoskeletal complaints Skin: Negative for skin complaints  Neurological: States mild headache All other ROS negative  ____________________________________________   PHYSICAL EXAM:  VITAL SIGNS: ED Triage Vitals  Enc Vitals Group     BP 10/29/17 1311 113/70     Pulse Rate 10/29/17 1311 92     Resp 10/29/17 1311 18     Temp 10/29/17 1311 98.9 F (37.2 C)     Temp Source 10/29/17 1311 Oral     SpO2 10/29/17 1311 96 %     Weight 10/29/17 1312 190 lb (86.2 kg)     Height 10/29/17 1312 5\' 7"  (1.702 m)     Head Circumference --  Peak Flow --      Pain Score 10/29/17 1311 10     Pain Loc --      Pain Edu? --      Excl. in GC? --    Constitutional: Alert and oriented. Well appearing and in no distress. Eyes: Normal exam ENT   Head: Normocephalic and atraumatic.   Mouth/Throat: Mucous membranes are moist. Cardiovascular: Normal rate, regular rhythm. No murmur Respiratory: Normal respiratory effort without tachypnea nor retractions. Breath sounds are clear Gastrointestinal: Soft and  nontender. No distention.  Musculoskeletal: Nontender with normal range of motion in all extremities. Neurologic:  Normal speech and language. No gross focal neurologic deficits Skin:  Skin is warm, dry and intact.  Psychiatric: Mood and affect are normal.     RADIOLOGY  Ultrasound pending  ____________________________________________   INITIAL IMPRESSION / ASSESSMENT AND PLAN / ED COURSE  Pertinent labs & imaging results that were available during my care of the patient were reviewed by me and considered in my medical decision making (see chart for details).  She presents emergency department for heavy vaginal bleeding over the past 3 days.  This is a patient's first menstrual cycle since her delivery 5 months ago.  Differential would include dysfunctional uterine bleeding, hormonal imbalance.  We will check labs.  Pelvic examination performed by myself shows minimal bleeding at this time, no obvious discharge. We will obtain a pelvic ultrasound to further evaluate.  If ultrasound shows no acute concerning findings anticipate likely discharge home with OB follow-up.  Patient agreeable to plan of care.  Patient's labs are largely normal limits.  Patient care signed out to Dr. Derrill KayGoodman, ultrasound pending.  ____________________________________________   FINAL CLINICAL IMPRESSION(S) / ED DIAGNOSES  Dysfunctional uterine bleeding    Minna AntisPaduchowski, Alois Mincer, MD 10/29/17 1529

## 2019-05-08 IMAGING — US US TRANSVAGINAL NON-OB
1 series · 14 of 25 positions shown · non-contrast
Comparison: None

CLINICAL DATA: Heavy vaginal bleeding. First. Since having a baby 5
months ago.

EXAM:
TRANSABDOMINAL AND TRANSVAGINAL ULTRASOUND OF PELVIS
TECHNIQUE: Both transabdominal and transvaginal ultrasound examinations of the
pelvis were performed. Transabdominal technique was performed for
global imaging of the pelvis including uterus, ovaries, adnexal
regions, and pelvic cul-de-sac. It was necessary to proceed with
endovaginal exam following the transabdominal exam to visualize the
uterus, endometrium and ovaries to better advantage.

[Series 1: us transvaginal non-ob · 0.25mm/px · 14 of 46 slices shown]
[im 1/46]
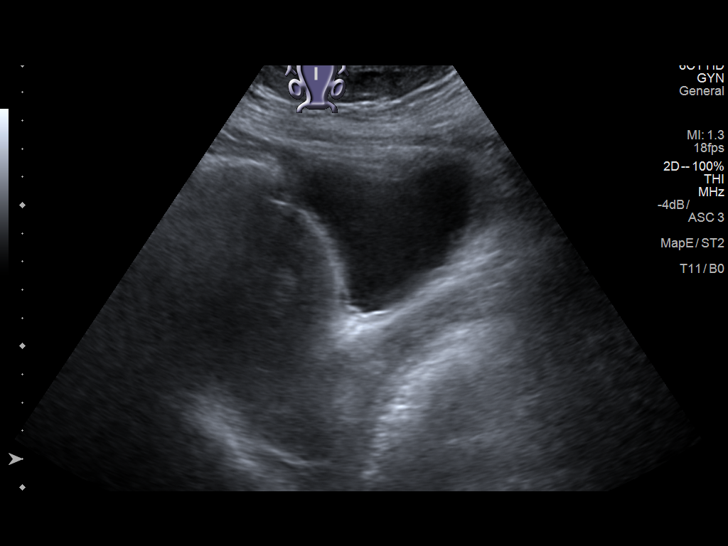
[im 4/46]
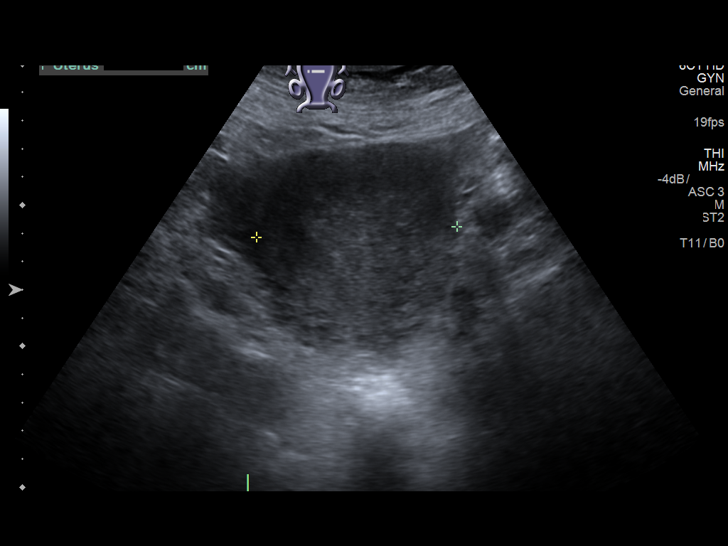
[im 8/46]
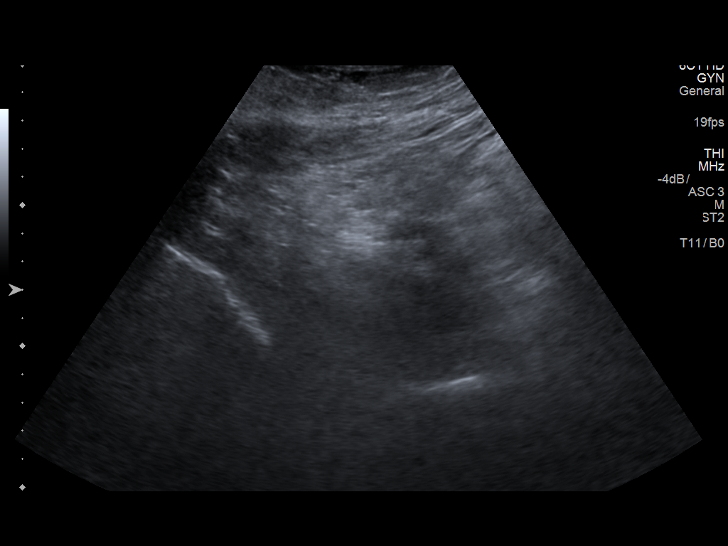
[im 12/46]
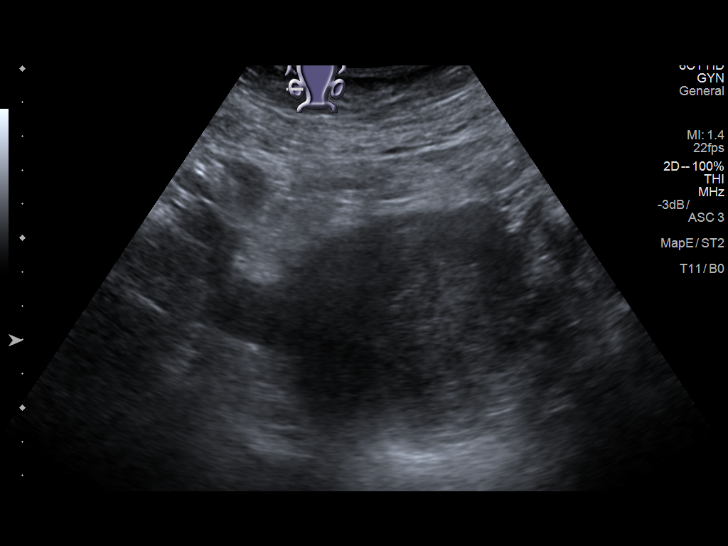
[im 16/46]
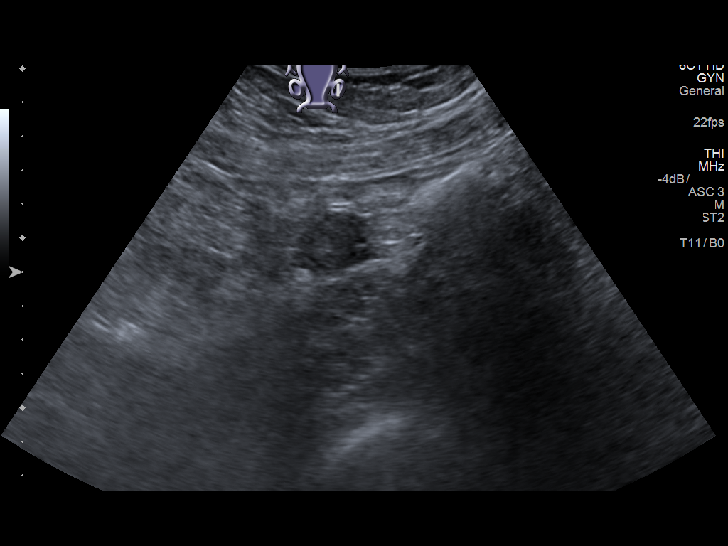
[im 17/46]
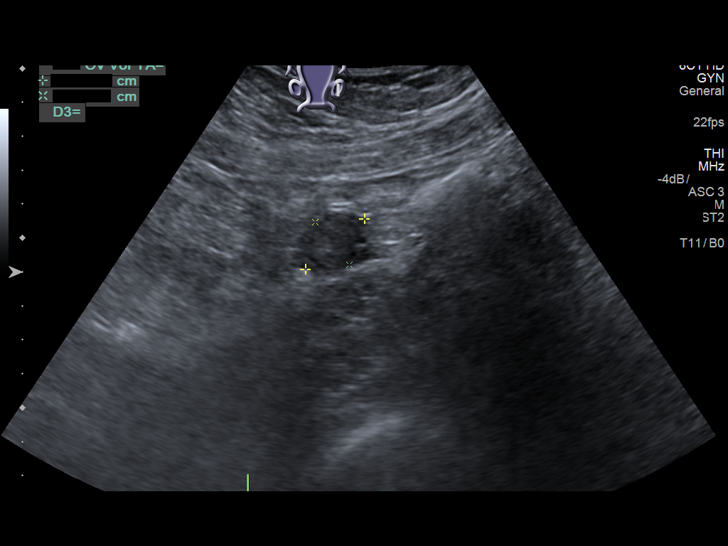
[im 21/46]
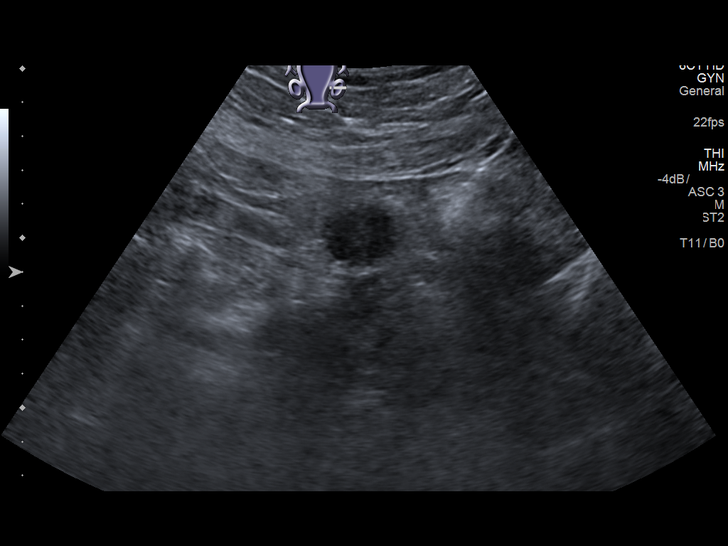
[im 25/46]
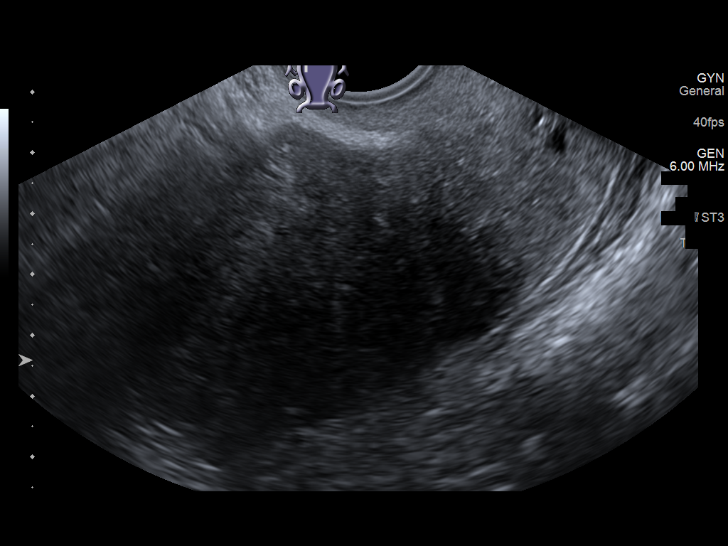
[im 29/46]
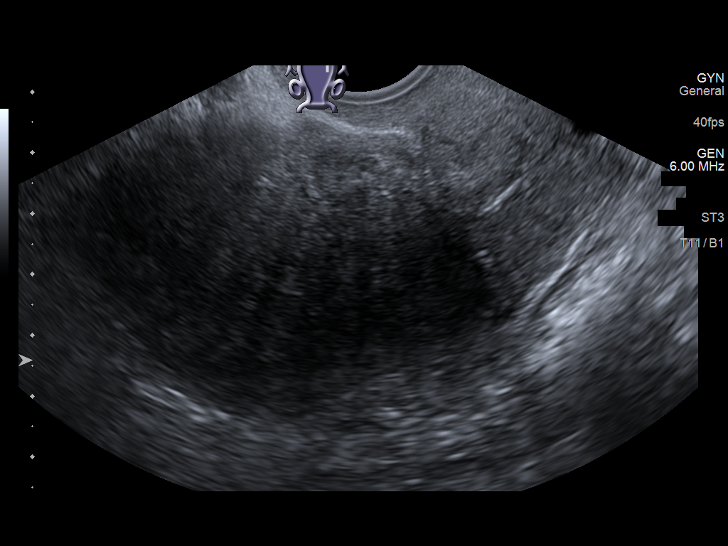
[im 31/46]
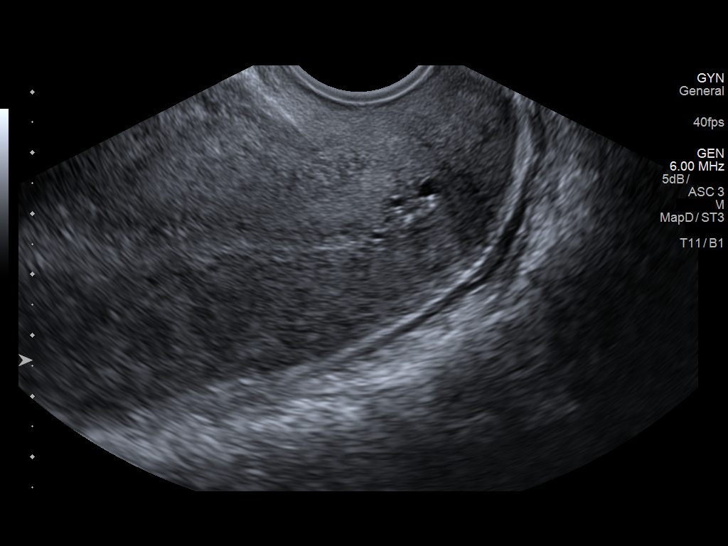
[im 34/46]
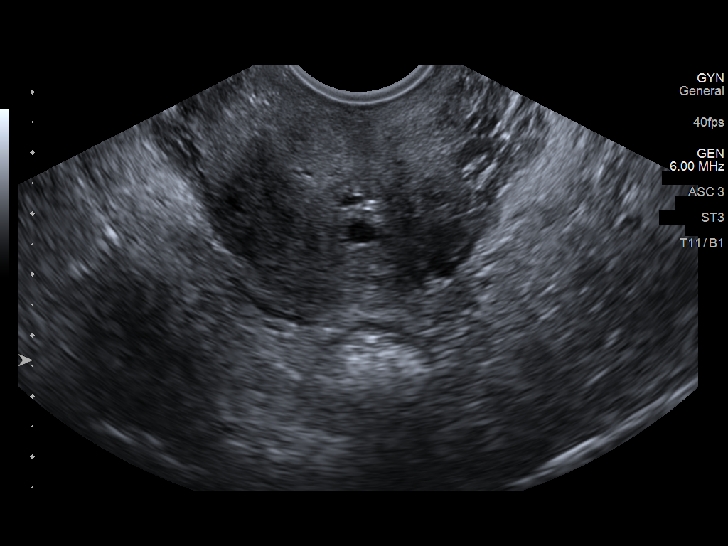
[im 38/46]
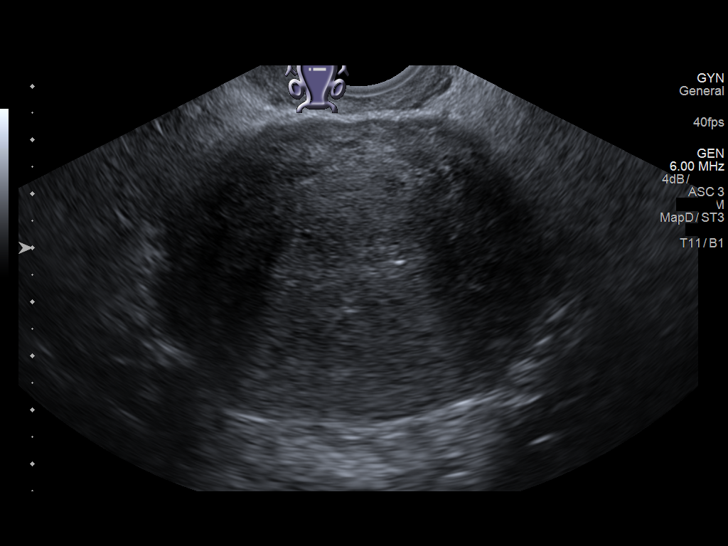
[im 42/46]
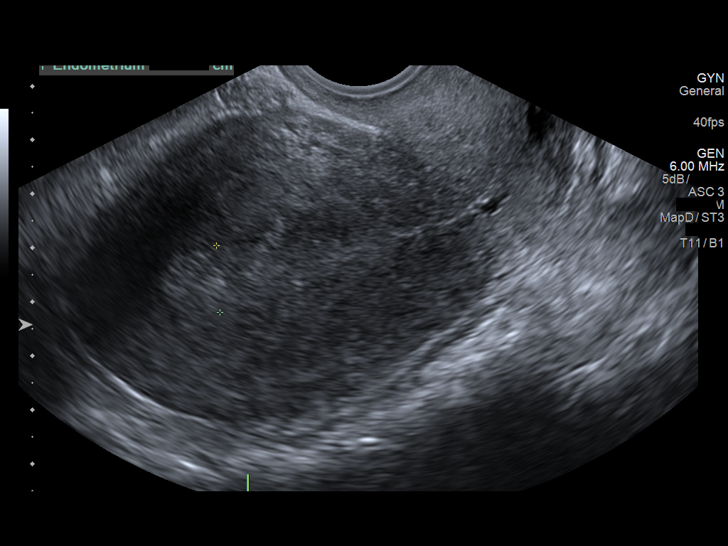
[im 46/46]
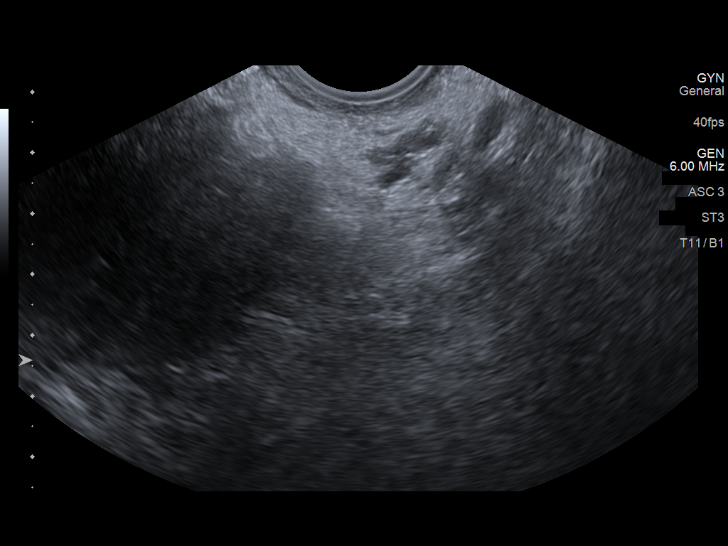

[14 of 25 positions shown; findings below may reference images not displayed]

FINDINGS: Uterus

Measurements: 9.3 x 4.4 x 7.1 cm. No fibroids or other mass
visualized.

Endometrium

Thickness: 12 mm.  No focal abnormality visualized.

Right ovary

Measurements: 2.5 x 1.7 x 1.9 cm. Normal appearance/no adnexal mass.

Left ovary

Measurements: 2.3 x 1.6 x 2.1 cm. Normal appearance/no adnexal mass.

Other findings

No abnormal free fluid.
IMPRESSION: Normal transabdominal and endovaginal pelvic ultrasound.

## 2022-03-31 ENCOUNTER — Other Ambulatory Visit: Payer: Self-pay | Admitting: Student in an Organized Health Care Education/Training Program

## 2022-03-31 DIAGNOSIS — R1013 Epigastric pain: Secondary | ICD-10-CM

## 2022-04-08 ENCOUNTER — Ambulatory Visit: Admission: RE | Admit: 2022-04-08 | Payer: Medicaid Other | Source: Ambulatory Visit

## 2022-04-09 ENCOUNTER — Ambulatory Visit
Admission: RE | Admit: 2022-04-09 | Discharge: 2022-04-09 | Disposition: A | Discharge status: Home / Self Care | Payer: Medicaid Other | Source: Ambulatory Visit | Attending: Family Medicine | Admitting: Family Medicine

## 2022-04-09 DIAGNOSIS — R1013 Epigastric pain: Secondary | ICD-10-CM | POA: Insufficient documentation

## 2023-10-14 ENCOUNTER — Other Ambulatory Visit: Payer: Self-pay | Admitting: Physician Assistant

## 2023-10-14 DIAGNOSIS — Z1231 Encounter for screening mammogram for malignant neoplasm of breast: Secondary | ICD-10-CM

## 2023-11-02 ENCOUNTER — Ambulatory Visit
Admission: RE | Admit: 2023-11-02 | Discharge: 2023-11-02 | Disposition: A | Discharge status: Home / Self Care | Source: Ambulatory Visit | Attending: Physician Assistant | Admitting: Physician Assistant

## 2023-11-02 ENCOUNTER — Other Ambulatory Visit: Payer: Self-pay | Admitting: Physician Assistant

## 2023-11-02 DIAGNOSIS — Z1231 Encounter for screening mammogram for malignant neoplasm of breast: Secondary | ICD-10-CM | POA: Insufficient documentation

## 2023-11-04 ENCOUNTER — Other Ambulatory Visit: Payer: Self-pay | Admitting: Physician Assistant

## 2023-11-04 DIAGNOSIS — R928 Other abnormal and inconclusive findings on diagnostic imaging of breast: Secondary | ICD-10-CM

## 2023-11-08 ENCOUNTER — Other Ambulatory Visit: Payer: Self-pay | Admitting: Family Medicine

## 2023-11-08 ENCOUNTER — Inpatient Hospital Stay
Admission: RE | Admit: 2023-11-08 | Discharge: 2023-11-08 | Disposition: A | Discharge status: Home / Self Care | Source: Ambulatory Visit | Attending: Physician Assistant | Admitting: Physician Assistant

## 2023-11-08 ENCOUNTER — Ambulatory Visit
Admission: RE | Admit: 2023-11-08 | Discharge: 2023-11-08 | Disposition: A | Discharge status: Home / Self Care | Source: Ambulatory Visit | Attending: Physician Assistant | Admitting: Physician Assistant

## 2023-11-08 DIAGNOSIS — R928 Other abnormal and inconclusive findings on diagnostic imaging of breast: Secondary | ICD-10-CM | POA: Diagnosis present

## 2023-11-11 ENCOUNTER — Ambulatory Visit
Admission: RE | Admit: 2023-11-11 | Discharge: 2023-11-11 | Disposition: A | Discharge status: Home / Self Care | Source: Ambulatory Visit | Attending: Family Medicine | Admitting: Family Medicine

## 2023-11-11 DIAGNOSIS — N6031 Fibrosclerosis of right breast: Secondary | ICD-10-CM | POA: Diagnosis present

## 2023-11-11 DIAGNOSIS — R928 Other abnormal and inconclusive findings on diagnostic imaging of breast: Secondary | ICD-10-CM | POA: Insufficient documentation

## 2023-11-11 HISTORY — PX: BREAST BIOPSY: SHX20

## 2023-11-11 MED ORDER — LIDOCAINE-EPINEPHRINE 1 %-1:100000 IJ SOLN
20.0000 mL | Freq: Once | INTRAMUSCULAR | Status: AC
  Administered 2023-11-11: 20 mL
  Filled 2023-11-11: qty 20

## 2023-11-11 MED ORDER — LIDOCAINE 1 % OPTIME INJ - NO CHARGE
5.0000 mL | Freq: Once | INTRAMUSCULAR | Status: AC
  Administered 2023-11-11: 5 mL
  Filled 2023-11-11: qty 6

## 2023-11-12 LAB — SURGICAL PATHOLOGY

## 2023-11-16 ENCOUNTER — Telehealth: Payer: Self-pay

## 2023-11-16 NOTE — Telephone Encounter (Signed)
   Pre-operative Risk Assessment    Patient Name: Bailey Simpson  DOB: [birth year] MRN: [id number]   Date of last office visit:  Date of next office visit:    Request for Surgical Clearance    Procedure:  Cosmetic surgery   Date of Surgery:  Clearance 12/09/23                                 Surgeon:  Dr. Lamar Ring  Surgeon's Group or Practice Name:  [facility]  Phone number:  [phone] Fax number:  [phone]   Type of Clearance Requested:   - Medical    Type of Anesthesia:  Not Indicated   Additional requests/questions:  requesting PT/PTT and CMP be performed within 90 days prior to surgery   Signed, Rebeca Blight   11/16/2023, 5:33 PM

## 2023-11-17 NOTE — Telephone Encounter (Signed)
 Pt is scheduled for IN OFFICE NEW/Preop appt 11/18/23 with Jon Hails, PA

## 2023-11-17 NOTE — Telephone Encounter (Signed)
 Pt has not been seen in our office and does not have appt scheduled at this time.  Will route to Preop Pool for the Preop APP to review

## 2023-11-17 NOTE — Telephone Encounter (Signed)
   Name: Bailey Simpson  DOB: [birth year]  MRN: [id number]  Primary Cardiologist: None  Chart reviewed as part of pre-operative protocol coverage. Because of [name]'s past medical history and time since last visit, she will require a follow-up in-office visit in order to better assess preoperative cardiovascular risk.  Pre-op covering staff: - Please schedule appointment and call patient to inform them. If patient already had an upcoming appointment within acceptable timeframe, please add pre-op clearance to the appointment notes so provider is aware. - Please contact requesting surgeon's office via preferred method (i.e, phone, fax) to inform them of need for appointment prior to surgery.   This patient does not follow with cardiology. I reached out to the requesting office who advised that anyone over the age of 50 requires cardiology clearance, regardless of history. The procedure is a breast lift.     [name], PA  11/17/2023, 11:25 AM

## 2023-11-17 NOTE — Progress Notes (Unsigned)
 [facility] New Patient Office Note:    Date:  11/18/2023   ID:  Hafsah, Hendler [birth year], MRN [id number]  PCP:  Fazzolare, [place], PA-C   [facility] Providers Cardiologist:  Vina Gull, MD Cardiology APP:  Madie Jon Garre, PA     Referring MD: Fazzolare, [place], [place]   Chief Complaint  Patient presents with   New Patient (Initial Visit)    Preoperative risk evaluation    History of Present Illness:    Bailey Simpson is a 53 y.o. female with a hx of gestational diabetes and asthma.   New patient visit requested for preoperative risk evaluation prior to surgery with plastics.  She has a history of H pylori and prediabetes. She has a history of anemia. Recent labs with PCP have been stable. She completed treatment for H Pylori.  She is active with her job. She also exercises routinely without chest pain or SOB. She is able to climb a flights of stairs without issues.   She is a never smoker, occasional alcohol use, no illicit drug use.  Family history: no hx of heart disease.  She is originally from [place], moved here 20 years ago. She has 5 children and lives at home with her youngest son who has autism.  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   Past Medical History:  Diagnosis Date   Gestational diabetes    Medical history non-contributory     Past Surgical History:  Procedure Laterality Date   BREAST BIOPSY Right 11/11/2023   MM RT BREAST BX W LOC DEV 1ST LESION IMAGE BX SPEC STEREO GUIDE 11/11/2023 [facility]   BREAST ENHANCEMENT SURGERY Bilateral    NO PAST SURGERIES      Current Medications: No outpatient medications have been marked as taking for the 11/18/23 encounter (Office Visit) with Madie Jon Garre, PA.     Allergies:   Pork-derived products and Shrimp [shellfish allergy]   Social History   Socioeconomic History   Marital status: Married    Spouse name: Not on file    Number of children: Not on file   Years of education: Not on file   Highest education level: Not on file  Occupational History   Not on file  Tobacco Use   Smoking status: Never   Smokeless tobacco: Never  Substance and Sexual Activity   Alcohol use: No   Drug use: No   Sexual activity: Yes    Birth control/protection: I.U.D.  Other Topics Concern   Not on file  Social History Narrative   Not on file   Social Drivers of Health   Financial Resource Strain: Not on file  Food Insecurity: Not on file  Transportation Needs: Not on file  Physical Activity: Not on file  Stress: Not on file  Social Connections: Not on file     Family History: The patient's family history includes Diabetes in her sister.  ROS:   Please see the history of present illness.     All other systems reviewed and are negative.  EKGs/Labs/Other Studies Reviewed:    The following studies were reviewed today:  EKG Interpretation Date/Time:  Thursday November 18 2023 13:27:35 EDT Ventricular Rate:  66 PR Interval:  188 QRS Duration:  94 QT Interval:  370 QTC Calculation: 387 R Axis:   40  Text Interpretation: Normal sinus rhythm Normal ECG When compared with ECG of 03-Mar-2017 01:00, No significant change was found Confirmed by  Madie Slough ([id number]) on 11/18/2023 1:30:41 PM    Recent Labs: No results found for requested labs within last 365 days.  Recent Lipid Panel No results found for: CHOL, TRIG, HDL, CHOLHDL, VLDL, LDLCALC, LDLDIRECT   Risk Assessment/Calculations:                Physical Exam:    VS:  BP 110/70   Pulse 66   Ht 5' 7 (1.702 m)   Wt 200 lb (90.7 kg)   LMP 10/29/2023   BMI 31.32 kg/m     Wt Readings from Last 3 Encounters:  11/18/23 200 lb (90.7 kg)  10/29/17 190 lb (86.2 kg)  06/07/17 235 lb (106.6 kg)     GEN:  Well nourished, well developed in no acute distress HEENT: Normal NECK: No JVD; No carotid bruits LYMPHATICS: No  lymphadenopathy CARDIAC: RRR, no murmurs, rubs, gallops RESPIRATORY:  Clear to auscultation without rales, wheezing or rhonchi  ABDOMEN: Soft, non-tender, non-distended MUSCULOSKELETAL:  No edema; No deformity  SKIN: Warm and dry NEUROLOGIC:  Alert and oriented x 3 PSYCHIATRIC:  Normal affect   ASSESSMENT:    1. Preoperative clearance   2. Preoperative cardiovascular examination    PLAN:    In order of problems listed above:  Preoperative risk evaluation prior to cosmetic surgery She has no prior cardiac history. She can complete more than 4.0 METS without angina. According to the RCRI, she has a 0.4% risk of MACE. She may proceed with surgery without further cardiovascular testing.   I will fax to her surgeon's office.   Follow up with cardiology PRN.    Medication Adjustments/Labs and Tests Ordered: Current medicines are reviewed at length with the patient today.  Concerns regarding medicines are outlined above.  Orders Placed This Encounter  Procedures   EKG 12-Lead   No orders of the defined types were placed in this encounter.   Patient Instructions  Medication Instructions:  Your physician recommends that you continue on your current medications as directed. Please refer to the Current Medication list given to you today.  *If you need a refill on your cardiac medications before your next appointment, please call your pharmacy*  Lab Work: None ordered If you have labs (blood work) drawn today and your tests are completely normal, you will receive your results only by: MyChart Message (if you have MyChart) OR A paper copy in the mail If you have any lab test that is abnormal or we need to change your treatment, we will call you to review the results.  Testing/Procedures: None ordered  Follow-Up: At [facility], you and your health needs are our priority.  As part of our continuing mission to provide you with exceptional heart care, our providers are  all part of one team.  This team includes your primary Cardiologist (physician) and Advanced Practice Providers or APPs (Physician Assistants and Nurse Practitioners) who all work together to provide you with the care you need, when you need it.  Your next appointment:   FOLLOW UP AS NEEDED   Provider:   ANY APP  We recommend signing up for the patient portal called MyChart.  Sign up information is provided on this After Visit Summary.  MyChart is used to connect with patients for Virtual Visits (Telemedicine).  Patients are able to view lab/test results, encounter notes, upcoming appointments, etc.  Non-urgent messages can be sent to your provider as well.   To learn more about what you can do with MyChart,  go to [email or web address].   Other Instructions        Signed, Jon Nat Hails, [place]  11/18/2023 3:56 PM    [facility]

## 2023-11-18 ENCOUNTER — Encounter: Payer: Self-pay | Admitting: Physician Assistant

## 2023-11-18 ENCOUNTER — Ambulatory Visit: Attending: Physician Assistant | Admitting: Physician Assistant

## 2023-11-18 VITALS — BP 110/70 | HR 66 | Ht 67.0 in | Wt 200.0 lb

## 2023-11-18 DIAGNOSIS — Z01810 Encounter for preprocedural cardiovascular examination: Secondary | ICD-10-CM | POA: Diagnosis not present

## 2023-11-18 DIAGNOSIS — Z01818 Encounter for other preprocedural examination: Secondary | ICD-10-CM | POA: Insufficient documentation

## 2023-11-18 NOTE — Patient Instructions (Signed)
 Medication Instructions:  Your physician recommends that you continue on your current medications as directed. Please refer to the Current Medication list given to you today.  *If you need a refill on your cardiac medications before your next appointment, please call your pharmacy*  Lab Work: None ordered If you have labs (blood work) drawn today and your tests are completely normal, you will receive your results only by: MyChart Message (if you have MyChart) OR A paper copy in the mail If you have any lab test that is abnormal or we need to change your treatment, we will call you to review the results.  Testing/Procedures: None ordered  Follow-Up: At [facility], you and your health needs are our priority.  As part of our continuing mission to provide you with exceptional heart care, our providers are all part of one team.  This team includes your primary Cardiologist (physician) and Advanced Practice Providers or APPs (Physician Assistants and Nurse Practitioners) who all work together to provide you with the care you need, when you need it.  Your next appointment:   FOLLOW UP AS NEEDED   Provider:   ANY APP  We recommend signing up for the patient portal called MyChart.  Sign up information is provided on this After Visit Summary.  MyChart is used to connect with patients for Virtual Visits (Telemedicine).  Patients are able to view lab/test results, encounter notes, upcoming appointments, etc.  Non-urgent messages can be sent to your provider as well.   To learn more about what you can do with MyChart, go to [email or web address].   Other Instructions
